# Patient Record
Sex: Male | Born: 1975 | Race: White | Hispanic: No | Marital: Married | Smoking: Never smoker
Health system: Southern US, Community
[De-identification: ages and names within clinical notes are randomized; demographics above are authoritative.]

## PROBLEM LIST (undated history)

## (undated) DIAGNOSIS — F329 Major depressive disorder, single episode, unspecified: Secondary | ICD-10-CM

## (undated) DIAGNOSIS — F32A Depression, unspecified: Secondary | ICD-10-CM

## (undated) DIAGNOSIS — T7840XA Allergy, unspecified, initial encounter: Secondary | ICD-10-CM

## (undated) DIAGNOSIS — G4700 Insomnia, unspecified: Secondary | ICD-10-CM

## (undated) DIAGNOSIS — K219 Gastro-esophageal reflux disease without esophagitis: Secondary | ICD-10-CM

## (undated) DIAGNOSIS — F524 Premature ejaculation: Secondary | ICD-10-CM

## (undated) DIAGNOSIS — M1990 Unspecified osteoarthritis, unspecified site: Secondary | ICD-10-CM

## (undated) DIAGNOSIS — L719 Rosacea, unspecified: Secondary | ICD-10-CM

## (undated) HISTORY — DX: Premature ejaculation: F52.4

## (undated) HISTORY — PX: ORIF METATARSAL FRACTURE: SUR942

## (undated) HISTORY — DX: Rosacea, unspecified: L71.9

## (undated) HISTORY — DX: Insomnia, unspecified: G47.00

## (undated) HISTORY — DX: Depression, unspecified: F32.A

## (undated) HISTORY — DX: Allergy, unspecified, initial encounter: T78.40XA

## (undated) HISTORY — DX: Unspecified osteoarthritis, unspecified site: M19.90

## (undated) HISTORY — DX: Major depressive disorder, single episode, unspecified: F32.9

## (undated) HISTORY — PX: FRACTURE SURGERY: SHX138

---

## 2008-09-05 HISTORY — PX: NASAL SEPTOPLASTY W/ TURBINOPLASTY: SHX2070

## 2008-09-17 ENCOUNTER — Ambulatory Visit: Payer: Self-pay | Admitting: Otolaryngology

## 2009-01-24 ENCOUNTER — Emergency Department: Payer: Self-pay | Admitting: Emergency Medicine

## 2013-02-28 ENCOUNTER — Ambulatory Visit: Payer: Self-pay | Admitting: Family Medicine

## 2014-04-15 ENCOUNTER — Ambulatory Visit: Payer: Self-pay | Admitting: Family Medicine

## 2014-07-29 IMAGING — CR DG THORACIC SPINE 2-3V
1 series · 4 of 4 positions shown · non-contrast
Comparison: none

REASON FOR EXAM: thoracic back pain
COMMENTS:

PROCEDURE:     KDR - KDXR THORACIC AP AND LATERAL  - February 28, 2013  [DATE]
RESULT:     AP and lateral projections of the thoracic spine show grossly
normal alignment with preservation of the disc bases and vertebral body
heights.

[Series 1: ap · 0.17mm/px · 4 of 4 slices shown]
[im 1/4]
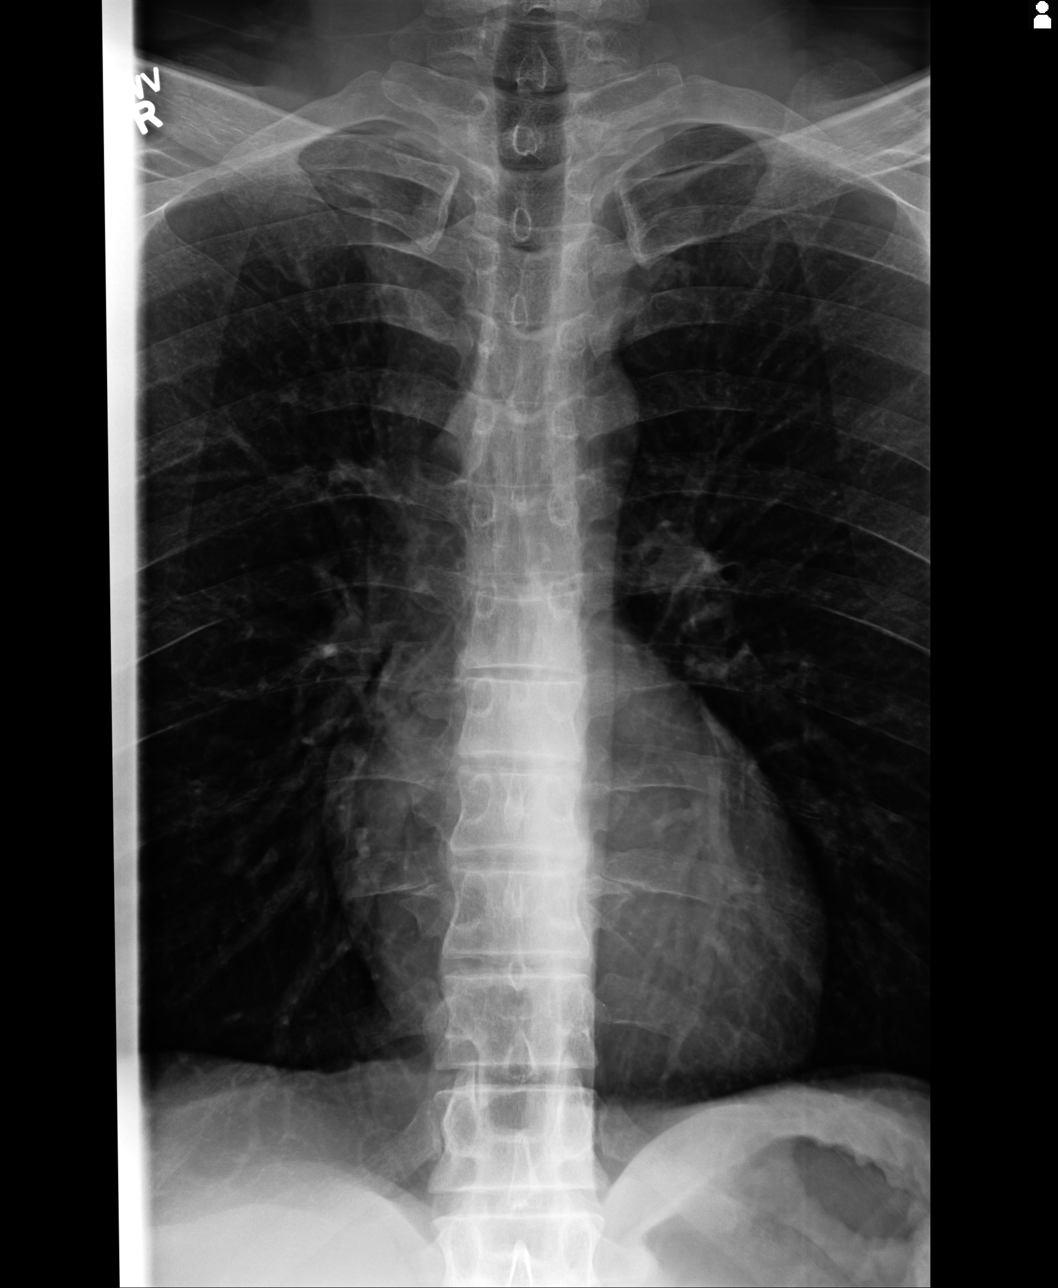
[im 2/4]
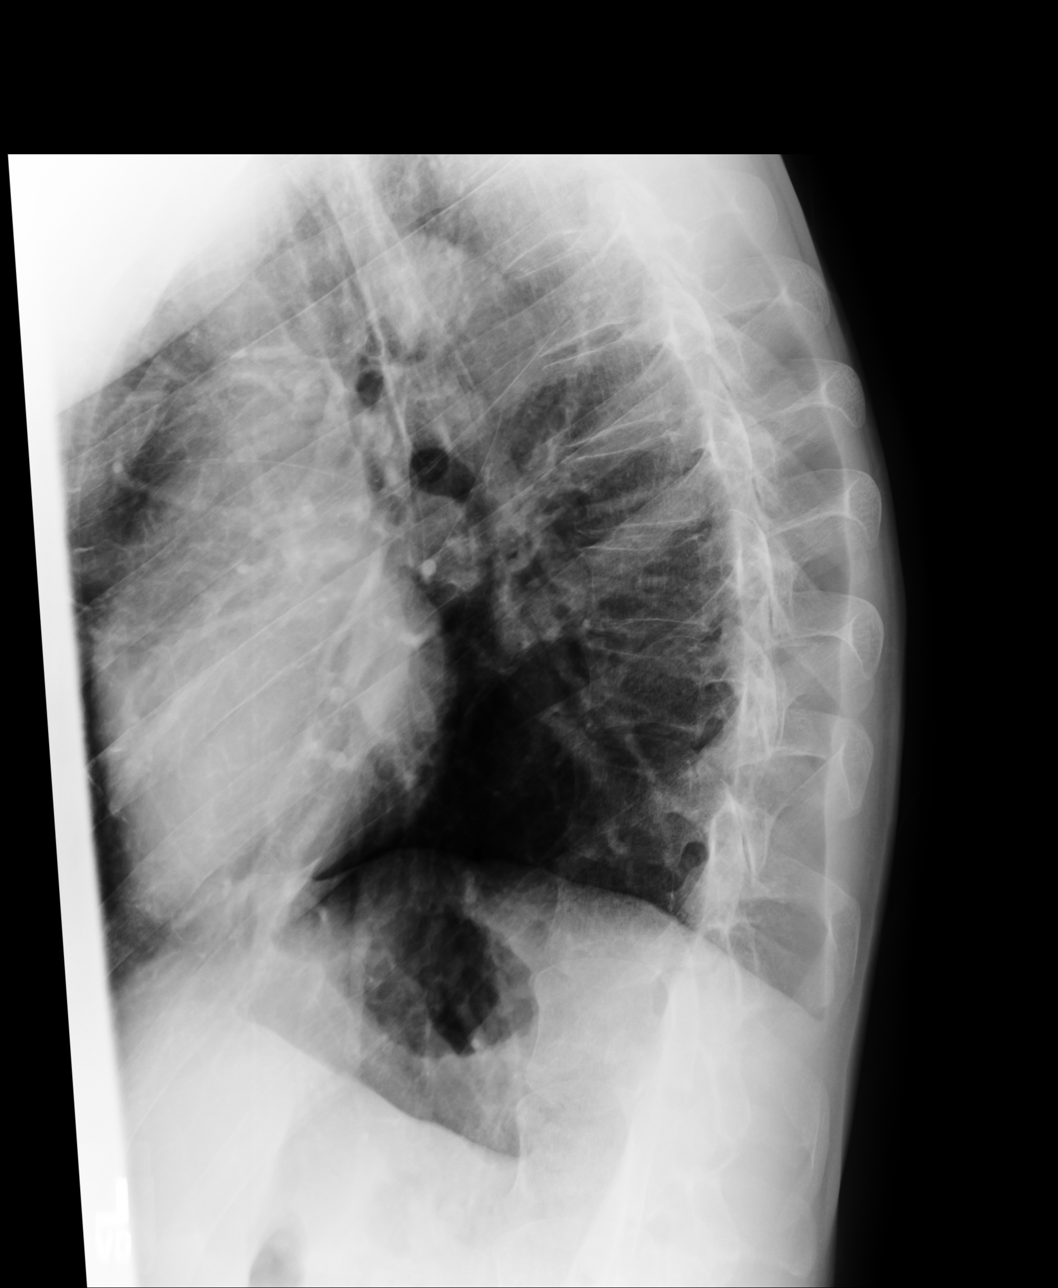
[im 3/4]
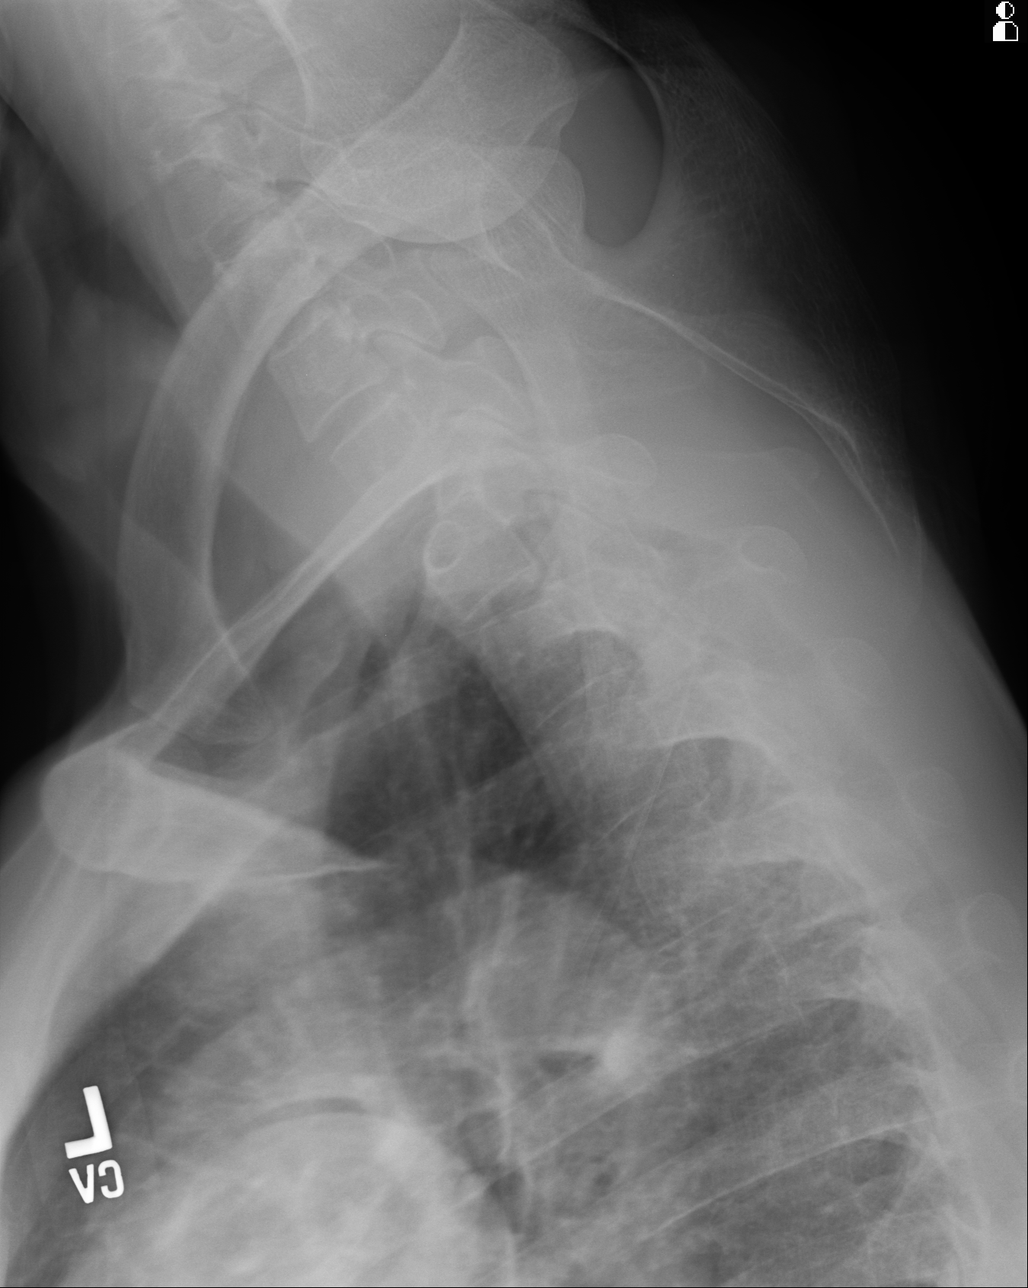
[im 4/4]
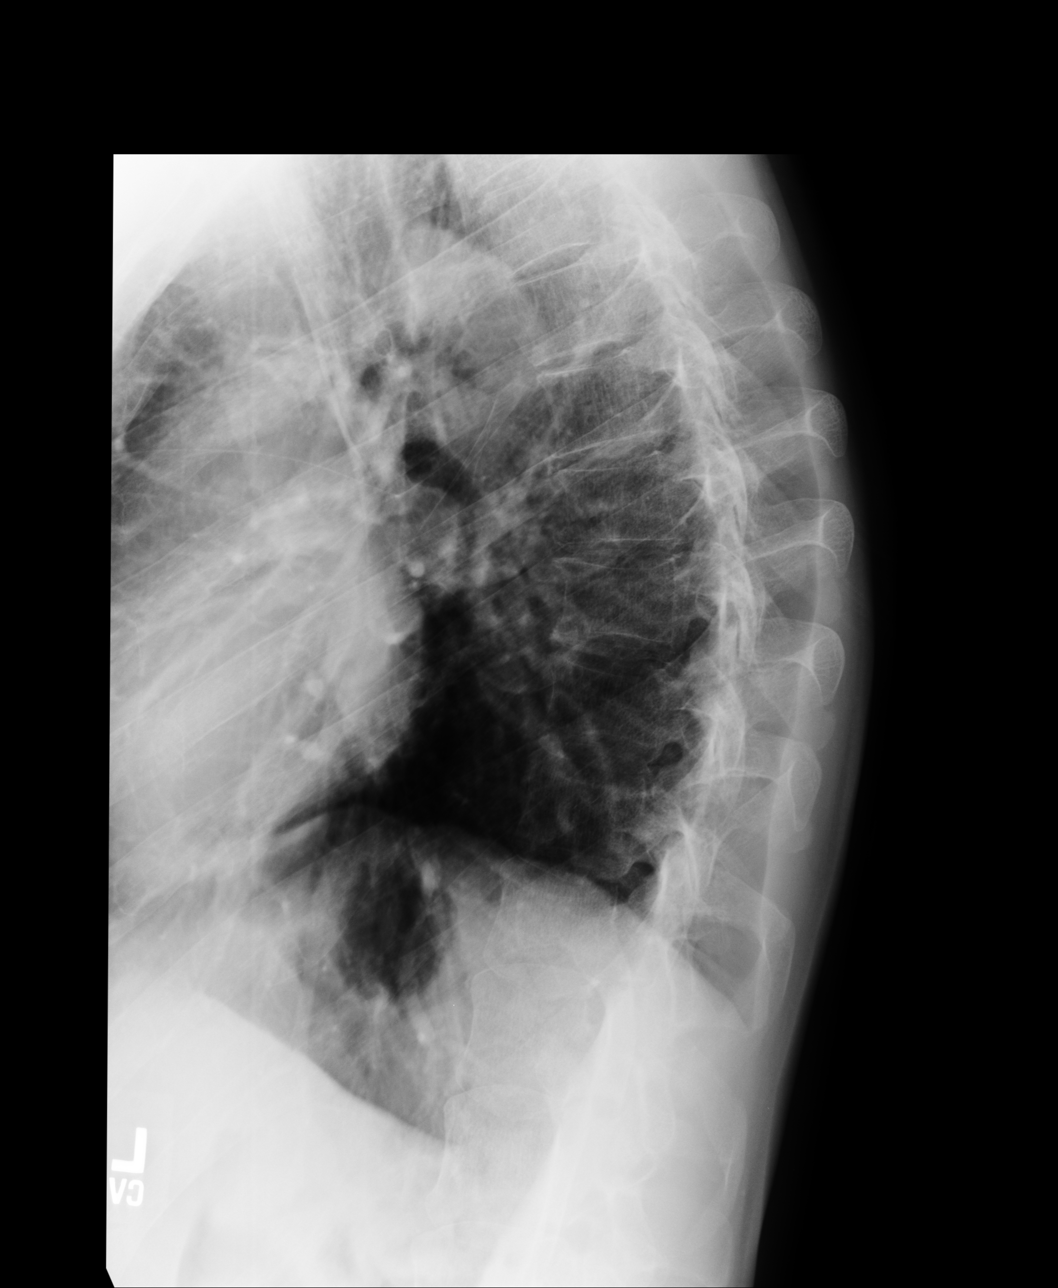

[4 of 4 positions shown; findings below may reference images not displayed]

IMPRESSION: No acute bony abnormality.

[REDACTED]

## 2014-08-14 HISTORY — PX: LAMINECTOMY: SHX219

## 2014-11-18 LAB — LIPID PANEL
Cholesterol: 163 mg/dL (ref 0–200)
HDL: 67 mg/dL (ref 35–70)
LDL Cholesterol: 81 mg/dL
Triglycerides: 77 mg/dL (ref 40–160)

## 2015-04-11 ENCOUNTER — Encounter: Payer: Self-pay | Admitting: Family Medicine

## 2015-04-11 DIAGNOSIS — M47817 Spondylosis without myelopathy or radiculopathy, lumbosacral region: Secondary | ICD-10-CM | POA: Insufficient documentation

## 2015-04-11 DIAGNOSIS — G4700 Insomnia, unspecified: Secondary | ICD-10-CM | POA: Insufficient documentation

## 2015-04-11 DIAGNOSIS — K5909 Other constipation: Secondary | ICD-10-CM | POA: Insufficient documentation

## 2015-04-11 DIAGNOSIS — J3089 Other allergic rhinitis: Secondary | ICD-10-CM

## 2015-04-11 DIAGNOSIS — F524 Premature ejaculation: Secondary | ICD-10-CM | POA: Insufficient documentation

## 2015-04-11 DIAGNOSIS — G4726 Circadian rhythm sleep disorder, shift work type: Secondary | ICD-10-CM | POA: Insufficient documentation

## 2015-04-11 DIAGNOSIS — J302 Other seasonal allergic rhinitis: Secondary | ICD-10-CM | POA: Insufficient documentation

## 2015-04-11 DIAGNOSIS — L719 Rosacea, unspecified: Secondary | ICD-10-CM | POA: Insufficient documentation

## 2015-04-11 DIAGNOSIS — M5136 Other intervertebral disc degeneration, lumbar region: Secondary | ICD-10-CM | POA: Insufficient documentation

## 2015-04-11 DIAGNOSIS — M5126 Other intervertebral disc displacement, lumbar region: Secondary | ICD-10-CM | POA: Insufficient documentation

## 2015-04-13 ENCOUNTER — Ambulatory Visit: Payer: Self-pay | Admitting: Family Medicine

## 2015-05-09 ENCOUNTER — Other Ambulatory Visit: Payer: Self-pay | Admitting: Family Medicine

## 2015-05-25 ENCOUNTER — Encounter: Payer: Self-pay | Admitting: Family Medicine

## 2015-05-25 ENCOUNTER — Ambulatory Visit (INDEPENDENT_AMBULATORY_CARE_PROVIDER_SITE_OTHER): Payer: BLUE CROSS/BLUE SHIELD | Admitting: Family Medicine

## 2015-05-25 VITALS — BP 110/66 | HR 70 | Temp 98.4°F | Resp 16 | Ht 70.0 in | Wt 167.0 lb

## 2015-05-25 DIAGNOSIS — G43009 Migraine without aura, not intractable, without status migrainosus: Secondary | ICD-10-CM | POA: Insufficient documentation

## 2015-05-25 DIAGNOSIS — Z23 Encounter for immunization: Secondary | ICD-10-CM

## 2015-05-25 DIAGNOSIS — F32A Depression, unspecified: Secondary | ICD-10-CM

## 2015-05-25 DIAGNOSIS — R1013 Epigastric pain: Secondary | ICD-10-CM

## 2015-05-25 DIAGNOSIS — G4700 Insomnia, unspecified: Secondary | ICD-10-CM

## 2015-05-25 DIAGNOSIS — F329 Major depressive disorder, single episode, unspecified: Secondary | ICD-10-CM

## 2015-05-25 MED ORDER — TRAZODONE HCL 100 MG PO TABS: 100.0000 mg | ORAL_TABLET | Freq: Every day | ORAL | Status: DC

## 2015-05-25 MED ORDER — OMEPRAZOLE 40 MG PO CPDR: 40.0000 mg | DELAYED_RELEASE_CAPSULE | Freq: Every day | ORAL | Status: DC

## 2015-05-25 MED ORDER — CITALOPRAM HYDROBROMIDE 40 MG PO TABS: 40.0000 mg | ORAL_TABLET | Freq: Every day | ORAL | Status: DC

## 2015-05-25 NOTE — Progress Notes (Signed)
Name: Ralph Mathews   MRN: [id number]    DOB: 1976-05-11   Date:05/25/2015       Progress Note  Subjective  Chief Complaint  Chief Complaint  Patient presents with  . Medication Refill    6 month F/U  . Depression  . Insomnia  . Abdominal Pain    epigastric pain onset off and on for 1 month and a little constipation    HPI  Insomnia: taking Trazodone and able to fall and stay asleep. He denies side effects of medication   Depression: taking Citalopram for many years, he feels like he is in remission. No side effects of medication .  Epigastric pain: symptoms started suddenly about one month ago. First episode the pain was aching and lasted three days, he states not associated with change in bowel movement ( constipation has been controlled ), since than episodes lasts at most one day, sometimes has abdominal distention and bloating, no blood in stools, no nausea , no vomiting, no weight loss, no fever. He has tried gas-X without improvement of symptoms.   Migraine Headaches: he has episode of left temporal area pain, described as throbbing, severe, has phonophobia and photophobia, but no nausea or vomiting. Symptoms started many years ago and resolves with Excedrin otc.     Patient Active Problem List   Diagnosis Date Noted  . Migraine without aura and without status migrainosus, not intractable 05/25/2015  . Insomnia, persistent 04/11/2015  . Chronic constipation 04/11/2015  . Clinical depression 04/11/2015  . Lumbosacral spondylosis without myelopathy 04/11/2015  . Circadian rhythm sleep disorder, shift work type 04/11/2015  . Bulge of lumbar disc without myelopathy 04/11/2015  . Ejaculates too soon 04/11/2015  . Allergic rhinitis 04/11/2015  . Acne erythematosa 04/11/2015     Family History  Problem Relation Age of Onset  . Depression Mother   . Hypertension Mother   . Obesity Mother   . Anxiety disorder Father     Social History   Social History  . Marital  Status: Married    Spouse Name: N/A  . Number of Children: N/A  . Years of Education: N/A   Occupational History  . Not on file.   Social History Main Topics  . Smoking status: Never Smoker   . Smokeless tobacco: Never Used  . Alcohol Use: 0.0 oz/week    0 Standard drinks or equivalent per week  . Drug Use: No  . Sexual Activity: Yes   Other Topics Concern  . Not on file   Social History Narrative     Current outpatient prescriptions:  .  citalopram (CELEXA) 40 MG tablet, Take 1 tablet (40 mg total) by mouth daily., Disp: 90 tablet, Rfl: 1 .  omeprazole (PRILOSEC) 40 MG capsule, Take 1 capsule (40 mg total) by mouth daily., Disp: 30 capsule, Rfl: 1 .  traZODone (DESYREL) 100 MG tablet, Take 1 tablet (100 mg total) by mouth at bedtime., Disp: 90 tablet, Rfl: 1  No Known Allergies   ROS  Constitutional: Negative for fever or weight change.  Respiratory: Negative for cough and shortness of breath.   Cardiovascular: Negative for chest pain or palpitations.  Gastrointestinal: Positive  for abdominal pain, no bowel changes.  Musculoskeletal: Negative for gait problem or joint swelling.  Skin: Negative for rash.  Neurological: Negative for dizziness or headache.  No other specific complaints in a complete review of systems (except as listed in HPI above).  Objective  Filed Vitals:   05/25/15 1109  BP:  110/66  Pulse: 70  Temp: 98.4 F (36.9 C)  TempSrc: Oral  Resp: 16  Height: 5\' 10"  (1.778 m)  Weight: 167 lb (75.751 kg)  SpO2: 98%    Body mass index is 23.96 kg/(m^2).  Physical Exam  Constitutional: Patient appears well-developed and well-nourished. No distress.  HEENT: head atraumatic, normocephalic, pupils equal and reactive to light,  neck supple, throat within normal limits Cardiovascular: Normal rate, regular rhythm and normal heart sounds.  No murmur heard. No BLE edema. Pulmonary/Chest: Effort normal and breath sounds normal. No respiratory  distress. Abdominal: Soft.  There is mild epigastric discomfort, also some pain on LLQ, no guarding or rebound tenderness Psychiatric: Patient has a normal mood and affect. behavior is normal. Judgment and thought content normal.   PHQ2/9: Depression screen PHQ 2/9 05/25/2015  Decreased Interest 0  Down, Depressed, Hopeless 0  PHQ - 2 Score 0     Fall Risk: Fall Risk  05/25/2015  Falls in the past year? No      Functional Status Survey: Is the patient deaf or have difficulty hearing?: No Does the patient have difficulty seeing, even when wearing glasses/contacts?: Yes (glasses) Does the patient have difficulty concentrating, remembering, or making decisions?: No Does the patient have difficulty walking or climbing stairs?: No Does the patient have difficulty dressing or bathing?: No Does the patient have difficulty doing errands alone such as visiting a doctor's office or shopping?: No   Assessment & Plan  1. Insomnia, persistent Doing well continue medication  - traZODone (DESYREL) 100 MG tablet; Take 1 tablet (100 mg total) by mouth at bedtime.  Dispense: 90 tablet; Refill: 1  2. Clinical depression Doing well on medication  - citalopram (CELEXA) 40 MG tablet; Take 1 tablet (40 mg total) by mouth daily.  Dispense: 90 tablet; Refill: 1  3. Needs flu shot  - Flu Vaccine QUAD 36+ mos PF IM (Fluarix & Fluzone Quad PF)  4. Epigastric pain On exam also some pain on LLQ, but Bristol scale was 4, discussed options, such as getting labs today : h. Pylori, CBC, comp panel, or try Omeprazole, and he chose the later, he will return in one month if no change in symptoms, if improves he will wean self off slowly  - omeprazole (PRILOSEC) 40 MG capsule; Take 1 capsule (40 mg total) by mouth daily.  Dispense: 30 capsule; Refill: 1  5. Migraine without aura and without status migrainosus, not intractable Doing well on prn medication

## 2015-05-26 ENCOUNTER — Ambulatory Visit: Payer: Self-pay | Admitting: Family Medicine

## 2015-06-18 ENCOUNTER — Other Ambulatory Visit: Payer: Self-pay | Admitting: Family Medicine

## 2015-06-18 NOTE — Telephone Encounter (Signed)
Informed patient and he will call back at a later date to schedule the 6 month follow up visit.

## 2015-12-21 ENCOUNTER — Other Ambulatory Visit: Payer: Self-pay | Admitting: Family Medicine

## 2015-12-22 NOTE — Telephone Encounter (Signed)
Patient requesting refill. 

## 2015-12-23 NOTE — Telephone Encounter (Signed)
Appointment made for 01-25-16 and patient informed that prescription has been refilled.

## 2016-01-25 ENCOUNTER — Ambulatory Visit: Payer: BLUE CROSS/BLUE SHIELD | Admitting: Family Medicine

## 2016-01-26 ENCOUNTER — Ambulatory Visit: Payer: BLUE CROSS/BLUE SHIELD | Admitting: Family Medicine

## 2016-02-02 ENCOUNTER — Encounter: Payer: Self-pay | Admitting: Family Medicine

## 2016-02-02 ENCOUNTER — Ambulatory Visit (INDEPENDENT_AMBULATORY_CARE_PROVIDER_SITE_OTHER): Payer: BLUE CROSS/BLUE SHIELD | Admitting: Family Medicine

## 2016-02-02 VITALS — BP 118/60 | HR 79 | Temp 98.8°F | Resp 16 | Ht 70.0 in | Wt 166.5 lb

## 2016-02-02 DIAGNOSIS — J3089 Other allergic rhinitis: Secondary | ICD-10-CM

## 2016-02-02 DIAGNOSIS — G4700 Insomnia, unspecified: Secondary | ICD-10-CM

## 2016-02-02 DIAGNOSIS — G43009 Migraine without aura, not intractable, without status migrainosus: Secondary | ICD-10-CM

## 2016-02-02 DIAGNOSIS — K219 Gastro-esophageal reflux disease without esophagitis: Secondary | ICD-10-CM | POA: Diagnosis not present

## 2016-02-02 DIAGNOSIS — F329 Major depressive disorder, single episode, unspecified: Secondary | ICD-10-CM | POA: Diagnosis not present

## 2016-02-02 DIAGNOSIS — J302 Other seasonal allergic rhinitis: Secondary | ICD-10-CM

## 2016-02-02 DIAGNOSIS — J309 Allergic rhinitis, unspecified: Secondary | ICD-10-CM

## 2016-02-02 DIAGNOSIS — F32A Depression, unspecified: Secondary | ICD-10-CM

## 2016-02-02 MED ORDER — ASPIRIN-ACETAMINOPHEN-CAFFEINE 250-250-65 MG PO TABS: 1.0000 | ORAL_TABLET | Freq: Four times a day (QID) | ORAL | Status: DC | PRN

## 2016-02-02 MED ORDER — TRAZODONE HCL 100 MG PO TABS: 100.0000 mg | ORAL_TABLET | Freq: Every evening | ORAL | Status: DC

## 2016-02-02 MED ORDER — CITALOPRAM HYDROBROMIDE 40 MG PO TABS: 40.0000 mg | ORAL_TABLET | Freq: Every day | ORAL | Status: DC

## 2016-02-02 MED ORDER — CETIRIZINE HCL 10 MG PO TABS: 10.0000 mg | ORAL_TABLET | Freq: Every day | ORAL | Status: DC

## 2016-02-02 MED ORDER — RANITIDINE HCL 150 MG PO TABS: 150.0000 mg | ORAL_TABLET | Freq: Two times a day (BID) | ORAL | Status: DC

## 2016-02-02 NOTE — Progress Notes (Signed)
Name: Ralph Mathews   MRN: [id number]    DOB: [birth year]   Date:02/02/2016       Progress Note  Subjective  Chief Complaint  Chief Complaint  Patient presents with  . Depression    medication refills    HPI  Insomnia: taking Trazodone and able to fall and stay asleep. He denies side effects of medication. No parasomnia.   Depression: taking Citalopram for many years No side effects of medication. He states he has noticed some lack of motivation lately. Currently works at [employer], but has summers off, needs to find a temporary job and is having difficulty getting started  Epigastric pain: symptoms resolved, seldom has some pain or heartburn that has been controlled with otc medication. Off Omeprazole now  Migraine Headaches: he has episodesof left temporal area pain, described as throbbing, severe, has phonophobia and photophobia, but no nausea or vomiting. Symptoms started many years ago and resolves with Excedrin otc.At most 2 episodes per month, but can last up to 3 days. Able to work through it    Patient Active Problem List   Diagnosis Date Noted  . Migraine without aura and without status migrainosus, not intractable 05/25/2015  . Insomnia, persistent 04/11/2015  . Chronic constipation 04/11/2015  . Clinical depression 04/11/2015  . Lumbosacral spondylosis without myelopathy 04/11/2015  . Circadian rhythm sleep disorder, shift work type 04/11/2015  . Bulge of lumbar disc without myelopathy 04/11/2015  . Ejaculates too soon 04/11/2015  . Perennial allergic rhinitis with seasonal variation 04/11/2015    Past Surgical History  Procedure Laterality Date  . Nasal septoplasty w/ turbinoplasty  09/05/2008  . Orif metatarsal fracture Right   . Laminectomy  08/14/2014    Family History  Problem Relation Age of Onset  . Depression Mother   . Hypertension Mother   . Obesity Mother   . Anxiety disorder Father     Social History   Social History  .  Marital Status: Married    Spouse Name: N/A  . Number of Children: N/A  . Years of Education: N/A   Occupational History  . Not on file.   Social History Main Topics  . Smoking status: Never Smoker   . Smokeless tobacco: Never Used  . Alcohol Use: 0.0 oz/week    0 Standard drinks or equivalent per week  . Drug Use: No  . Sexual Activity: Yes   Other Topics Concern  . Not on file   Social History Narrative     Current outpatient prescriptions:  .  aspirin-acetaminophen-caffeine (EXCEDRIN MIGRAINE) 250-250-65 MG tablet, Take 1 tablet by mouth every 6 (six) hours as needed for headache., Disp: 30 tablet, Rfl: 0 .  cetirizine (ZYRTEC) 10 MG tablet, Take 1 tablet (10 mg total) by mouth daily., Disp: 30 tablet, Rfl: 0 .  citalopram (CELEXA) 40 MG tablet, Take 1 tablet (40 mg total) by mouth daily., Disp: 90 tablet, Rfl: 1 .  ranitidine (ZANTAC) 150 MG tablet, Take 1 tablet (150 mg total) by mouth 2 (two) times daily., Disp: 30 tablet, Rfl: 0 .  traZODone (DESYREL) 100 MG tablet, Take 1 tablet (100 mg total) by mouth every evening., Disp: 90 tablet, Rfl: 1  No Known Allergies   ROS  Ten systems reviewed and is negative except as mentioned in HPI   Objective  Filed Vitals:   02/02/16 1619  BP: 118/60  Pulse: 79  Temp: 98.8 F (37.1 C)  TempSrc: Oral  Resp: 16  Height: 5\' 10"  (1.778  m)  Weight: 166 lb 8 oz (75.524 kg)  SpO2: 98%    Body mass index is 23.89 kg/(m^2).  Physical Exam  Constitutional: Patient appears well-developed and well-nourished.  No distress.  HEENT: head atraumatic, normocephalic, pupils equal and reactive to light, neck supple, throat within normal limits Cardiovascular: Normal rate, regular rhythm and normal heart sounds.  No murmur heard. No BLE edema. Pulmonary/Chest: Effort normal and breath sounds normal. No respiratory distress. Abdominal: Soft.  There is mild supra-pubic  Tenderness. It may be muscular, no dysuria or change in bowel  movement Psychiatric: Patient has a normal mood and affect. behavior is normal. Judgment and thought content normal.  PHQ2/9: Depression screen [facility] 2/9 02/02/2016 05/25/2015  Decreased Interest 0 0  Down, Depressed, Hopeless 0 0  PHQ - 2 Score 0 0    Fall Risk: Fall Risk  02/02/2016 05/25/2015  Falls in the past year? No No    Functional Status Survey: Is the patient deaf or have difficulty hearing?: No Does the patient have difficulty seeing, even when wearing glasses/contacts?: No Does the patient have difficulty concentrating, remembering, or making decisions?: No Does the patient have difficulty walking or climbing stairs?: No Does the patient have difficulty dressing or bathing?: No Does the patient have difficulty doing errands alone such as visiting a doctor's office or shopping?: No    Assessment & Plan  1. Migraine without aura and without status migrainosus, not intractable  - aspirin-acetaminophen-caffeine (EXCEDRIN MIGRAINE) 250-250-65 MG tablet; Take 1 tablet by mouth every 6 (six) hours as needed for headache.  Dispense: 30 tablet; Refill: 0  2. Insomnia, persistent  - traZODone (DESYREL) 100 MG tablet; Take 1 tablet (100 mg total) by mouth every evening.  Dispense: 90 tablet; Refill: 1  3. Clinical depression  - citalopram (CELEXA) 40 MG tablet; Take 1 tablet (40 mg total) by mouth daily.  Dispense: 90 tablet; Refill: 1  4. Perennial allergic rhinitis with seasonal variation  - cetirizine (ZYRTEC) 10 MG tablet; Take 1 tablet (10 mg total) by mouth daily.  Dispense: 30 tablet; Refill: 0  5. Gastroesophageal reflux disease without esophagitis  - ranitidine (ZANTAC) 150 MG tablet; Take 1 tablet (150 mg total) by mouth 2 (two) times daily.  Dispense: 30 tablet; Refill: 0

## 2016-08-05 ENCOUNTER — Ambulatory Visit (INDEPENDENT_AMBULATORY_CARE_PROVIDER_SITE_OTHER): Payer: BLUE CROSS/BLUE SHIELD | Admitting: Family Medicine

## 2016-08-05 ENCOUNTER — Encounter: Payer: Self-pay | Admitting: Family Medicine

## 2016-08-05 VITALS — BP 122/68 | HR 65 | Temp 97.8°F | Resp 16 | Ht 70.0 in | Wt 169.1 lb

## 2016-08-05 DIAGNOSIS — G4700 Insomnia, unspecified: Secondary | ICD-10-CM | POA: Diagnosis not present

## 2016-08-05 DIAGNOSIS — Z9889 Other specified postprocedural states: Secondary | ICD-10-CM | POA: Diagnosis not present

## 2016-08-05 DIAGNOSIS — Z13220 Encounter for screening for lipoid disorders: Secondary | ICD-10-CM

## 2016-08-05 DIAGNOSIS — G43009 Migraine without aura, not intractable, without status migrainosus: Secondary | ICD-10-CM | POA: Diagnosis not present

## 2016-08-05 DIAGNOSIS — Z131 Encounter for screening for diabetes mellitus: Secondary | ICD-10-CM

## 2016-08-05 DIAGNOSIS — M5416 Radiculopathy, lumbar region: Secondary | ICD-10-CM

## 2016-08-05 DIAGNOSIS — Z0000 Encounter for general adult medical examination without abnormal findings: Secondary | ICD-10-CM

## 2016-08-05 DIAGNOSIS — F325 Major depressive disorder, single episode, in full remission: Secondary | ICD-10-CM

## 2016-08-05 DIAGNOSIS — Z23 Encounter for immunization: Secondary | ICD-10-CM | POA: Diagnosis not present

## 2016-08-05 DIAGNOSIS — Z79899 Other long term (current) drug therapy: Secondary | ICD-10-CM

## 2016-08-05 LAB — CBC WITH DIFFERENTIAL/PLATELET
Basophils Absolute: 0 cells/uL (ref 0–200)
Basophils Relative: 0 %
Eosinophils Absolute: 240 cells/uL (ref 15–500)
Eosinophils Relative: 5 %
HCT: 49.2 % (ref 38.5–50.0)
Hemoglobin: 16.2 g/dL (ref 13.2–17.1)
Lymphocytes Relative: 31 %
Lymphs Abs: 1488 cells/uL (ref 850–3900)
MCH: 30.1 pg (ref 27.0–33.0)
MCHC: 32.9 g/dL (ref 32.0–36.0)
MCV: 91.4 fL (ref 80.0–100.0)
MPV: 8.9 fL (ref 7.5–12.5)
Monocytes Absolute: 528 cells/uL (ref 200–950)
Monocytes Relative: 11 %
Neutro Abs: 2544 cells/uL (ref 1500–7800)
Neutrophils Relative %: 53 %
Platelets: 216 10*3/uL (ref 140–400)
RBC: 5.38 MIL/uL (ref 4.20–5.80)
RDW: 14 % (ref 11.0–15.0)
WBC: 4.8 10*3/uL (ref 3.8–10.8)

## 2016-08-05 MED ORDER — PREDNISONE 10 MG (48) PO TBPK: ORAL_TABLET | Freq: Every day | ORAL | 0 refills | Status: DC

## 2016-08-05 MED ORDER — CITALOPRAM HYDROBROMIDE 40 MG PO TABS
40.0000 mg | ORAL_TABLET | Freq: Every day | ORAL | 1 refills | Status: DC
Start: 2016-08-05 — End: 2017-03-06

## 2016-08-05 MED ORDER — TRAZODONE HCL 100 MG PO TABS: 100.0000 mg | ORAL_TABLET | Freq: Every evening | ORAL | 1 refills | Status: DC

## 2016-08-05 MED ORDER — SUMATRIPTAN SUCCINATE 100 MG PO TABS: 100.0000 mg | ORAL_TABLET | ORAL | 0 refills | Status: DC | PRN

## 2016-08-05 NOTE — Progress Notes (Signed)
Name: Ralph Mathews   MRN: [id number]    DOB: 07-01-1976   Date:08/05/2016       Progress Note  Subjective  Chief Complaint  Chief Complaint  Patient presents with  . Annual Exam    HPI  Well Male Exam: he is feeling well, he exercises on a regular basis, he still has premature ejaculation, but seems to not interfere with his life. He has a balanced diet.   History of back surgery: back in 2015 by Dr. Hal Neer, he has noticed burning sensation on right sacral area and radiates down right lateral thigh, that is worse when sitting for a prolonged period of time or extending right leg. No weakness, no bowel or bladder incontinence.   Depression: taking Citalopram for many years No side effects of medication. Switching jobs in a couple of weeks, used to work at [employer] but had [employer] but will start at [employer] 08/15/2016  Migraine Headaches: he has episodes of left temporal area pain, described as starting as a throbbing sensation and sometimes radiates to frontal area, at times it is  severe, has phonophobia and photophobia, occasionally associated with nausea or vomiting. Symptoms started many years ago and resolves with Excedrin otc.At most 2 episodes per month, but can last up to 3 days. Able to work through it. Last episode was a couple of days ago and it was very intense. We will try triptans and he is willing to try it. Discussed possible side effects. May need to go on preventive medication  Insomnia: he has been taking Trazodone and is able to fall and stay asleep most nights.     Patient Active Problem List   Diagnosis Date Noted  . Major depression in remission ([place]) 08/05/2016  . Migraine without aura and without status migrainosus, not intractable 05/25/2015  . Insomnia, persistent 04/11/2015  . Chronic constipation 04/11/2015  . Lumbosacral spondylosis without myelopathy 04/11/2015  . Circadian rhythm sleep disorder, shift work type 04/11/2015  . Bulge of  lumbar disc without myelopathy 04/11/2015  . Ejaculates too soon 04/11/2015  . Perennial allergic rhinitis with seasonal variation 04/11/2015    Past Surgical History:  Procedure Laterality Date  . LAMINECTOMY  08/14/2014  . NASAL SEPTOPLASTY W/ TURBINOPLASTY  09/05/2008  . ORIF METATARSAL FRACTURE Right     Family History  Problem Relation Age of Onset  . Depression Mother   . Hypertension Mother   . Obesity Mother   . Anxiety disorder Father     Social History   Social History  . Marital status: Married    Spouse name: N/A  . Number of children: N/A  . Years of education: N/A   Occupational History  . Not on file.   Social History Main Topics  . Smoking status: Never Smoker  . Smokeless tobacco: Never Used  . Alcohol use 0.0 oz/week  . Drug use: No  . Sexual activity: Yes   Other Topics Concern  . Not on file   Social History Narrative  . No narrative on file     Current Outpatient Prescriptions:  .  aspirin-acetaminophen-caffeine (EXCEDRIN MIGRAINE) 250-250-65 MG tablet, Take 1 tablet by mouth every 6 (six) hours as needed for headache., Disp: 30 tablet, Rfl: 0 .  cetirizine (ZYRTEC) 10 MG tablet, Take 1 tablet (10 mg total) by mouth daily., Disp: 30 tablet, Rfl: 0 .  citalopram (CELEXA) 40 MG tablet, Take 1 tablet (40 mg total) by mouth daily., Disp: 90 tablet, Rfl: 1 .  SUMAtriptan (IMITREX) 100 MG tablet, Take 1 tablet (100 mg total) by mouth every 2 (two) hours as needed for migraine. May repeat in 2 hours if headache persists or recurs., Disp: 10 tablet, Rfl: 0 .  traZODone (DESYREL) 100 MG tablet, Take 1 tablet (100 mg total) by mouth every evening., Disp: 90 tablet, Rfl: 1  No Known Allergies   ROS  Constitutional: Negative for fever or weight change.  Respiratory: Negative for cough and shortness of breath.   Cardiovascular: Negative for chest pain or palpitations.  Gastrointestinal: Negative for abdominal pain, no bowel changes.   Musculoskeletal: Positive for gait problem - when he first starts to walk secondary to radiculitis, no joint swelling.  Skin: positive for rash.  Neurological: Negative for dizziness, positive for intermittent  headache.  No other specific complaints in a complete review of systems (except as listed in HPI above).   Objective  Vitals:   08/05/16 0825  BP: 122/68  Pulse: 65  Resp: 16  Temp: 97.8 F (36.6 C)  TempSrc: Oral  SpO2: 95%  Weight: 169 lb 1 oz (76.7 kg)  Height: 5\' 10"  (1.778 m)    Body mass index is 24.26 kg/m.  Physical Exam  Constitutional: Patient appears well-developed and well-nourished. No distress.  HENT: Head: Normocephalic and atraumatic. Ears: B TMs ok, no erythema or effusion; Nose: Nose normal. Mouth/Throat: Oropharynx is clear and moist. No oropharyngeal exudate.  Eyes: Conjunctivae and EOM are normal. Pupils are equal, round, and reactive to light. No scleral icterus.  Neck: Normal range of motion. Neck supple. No JVD present. No thyromegaly present.  Cardiovascular: Normal rate, regular rhythm and normal heart sounds.  No murmur heard. No BLE edema. Pulmonary/Chest: Effort normal and breath sounds normal. No respiratory distress. Abdominal: Soft. Bowel sounds are normal, no distension. There is no tenderness. no masses MALE GENITALIA: Normal descended testes bilaterally, no masses palpated, no hernias, no lesions, no discharge RECTAL: not done Musculoskeletal: Normal range of motion, no joint effusions. No gross deformities. Scar from previous laminectomy, pain during palpation of right lower back, pain when sitting up straight and with left lateral bending. Positive straight leg raise on the right  Neurological: he is alert and oriented to person, place, and time. No cranial nerve deficit. Coordination, balance, strength, speech and gait are normal.  Skin: Skin is warm and dry. He has some linear rash on both upper arms ( he was working in his yard a  couple of weeks ago -using topical medication and getting better now ) No erythema.  Psychiatric: Patient has a normal mood and affect. behavior is normal. Judgment and thought content normal.  PHQ2/9: Depression screen [facility] 2/9 08/05/2016 02/02/2016 05/25/2015  Decreased Interest 0 0 0  Down, Depressed, Hopeless 0 0 0  PHQ - 2 Score 0 0 0    Fall Risk: Fall Risk  08/05/2016 02/02/2016 05/25/2015  Falls in the past year? No No No    Functional Status Survey: Is the patient deaf or have difficulty hearing?: No Does the patient have difficulty seeing, even when wearing glasses/contacts?: No Does the patient have difficulty concentrating, remembering, or making decisions?: No Does the patient have difficulty walking or climbing stairs?: No Does the patient have difficulty dressing or bathing?: No Does the patient have difficulty doing errands alone such as visiting a doctor's office or shopping?: No   Assessment & Plan  1. Encounter for routine history and physical exam for male  Discussed importance of 150 minutes of physical  activity weekly, eat two servings of fish weekly, eat one serving of tree nuts ( cashews, pistachios, pecans, almonds.Marland Kitchen) every other day, eat 6 servings of fruit/vegetables daily and drink plenty of water and avoid sweet beverages.  - CBC with Differential/Platelet - COMPLETE METABOLIC PANEL WITH GFR - Hemoglobin A1c - Lipid panel - TSH - Vitamin B12 - VITAMIN D 25 Hydroxy (Vit-D Deficiency, Fractures)  2. Needs flu shot  - Flu Vaccine QUAD 36+ mos PF IM (Fluarix & Fluzone Quad PF)  3. Insomnia, persistent  - traZODone (DESYREL) 100 MG tablet; Take 1 tablet (100 mg total) by mouth every evening.  Dispense: 90 tablet; Refill: 1  4. Migraine without aura and without status migrainosus, not intractable  - SUMAtriptan (IMITREX) 100 MG tablet; Take 1 tablet (100 mg total) by mouth every 2 (two) hours as needed for migraine. May repeat in 2 hours if headache  persists or recurs.  Dispense: 10 tablet; Refill: 0  5. Major depression in remission ([place])  - citalopram (CELEXA) 40 MG tablet; Take 1 tablet (40 mg total) by mouth daily.  Dispense: 90 tablet; Refill: 1  6. History of back surgery  - predniSONE (STERAPRED UNI-PAK 48 TAB) 10 MG (48) TBPK tablet; Take by mouth daily. Take as directed - taper  Dispense: 48 tablet; Refill: 0  7. Right lumbar radiculitis  - predniSONE (STERAPRED UNI-PAK 48 TAB) 10 MG (48) TBPK tablet; Take by mouth daily. Take as directed - taper  Dispense: 48 tablet; Refill: 0

## 2016-08-06 LAB — COMPLETE METABOLIC PANEL WITH GFR
ALT: 14 U/L (ref 9–46)
AST: 20 U/L (ref 10–40)
Albumin: 4.2 g/dL (ref 3.6–5.1)
Alkaline Phosphatase: 56 U/L (ref 40–115)
BUN: 17 mg/dL (ref 7–25)
CO2: 29 mmol/L (ref 20–31)
Calcium: 9.1 mg/dL (ref 8.6–10.3)
Chloride: 104 mmol/L (ref 98–110)
Creat: 1.19 mg/dL (ref 0.60–1.35)
GFR, Est African American: 88 mL/min (ref >=60)
GFR, Est Non African American: 76 mL/min (ref >=60)
Glucose, Bld: 94 mg/dL (ref 65–99)
Potassium: 4.9 mmol/L (ref 3.5–5.3)
Sodium: 140 mmol/L (ref 135–146)
Total Bilirubin: 0.6 mg/dL (ref 0.2–1.2)
Total Protein: 6.6 g/dL (ref 6.1–8.1)

## 2016-08-06 LAB — LIPID PANEL
Cholesterol: 179 mg/dL (ref <200)
HDL: 65 mg/dL (ref >40)
LDL Cholesterol: 96 mg/dL (ref <100)
Total CHOL/HDL Ratio: 2.8 Ratio (ref <5.0)
Triglycerides: 91 mg/dL (ref <150)
VLDL: 18 mg/dL (ref <30)

## 2016-08-06 LAB — VITAMIN B12: Vitamin B-12: 540 pg/mL (ref 200–1100)

## 2016-08-06 LAB — TSH: TSH: 0.74 mIU/L (ref 0.40–4.50)

## 2016-08-06 LAB — VITAMIN D 25 HYDROXY (VIT D DEFICIENCY, FRACTURES): Vit D, 25-Hydroxy: 39 ng/mL (ref 30–100)

## 2016-08-06 LAB — HEMOGLOBIN A1C
Hgb A1c MFr Bld: 5.2 % (ref <5.7)
Mean Plasma Glucose: 103 mg/dL

## 2017-01-31 ENCOUNTER — Telehealth: Payer: Self-pay | Admitting: Family Medicine

## 2017-01-31 DIAGNOSIS — G4700 Insomnia, unspecified: Secondary | ICD-10-CM

## 2017-02-01 ENCOUNTER — Encounter: Payer: Self-pay | Admitting: Family Medicine

## 2017-02-01 NOTE — Telephone Encounter (Signed)
Raquel Sarna can see him, please schedule

## 2017-02-01 NOTE — Telephone Encounter (Signed)
You are exactly right - Please schedule the patient for his 43mo follow up with Dr. Ancil Boozer in the next 30 days. Please let him know that I have sent in a 30 day supply, and he will be able to obtain a larger refill once he comes for his follow up. Thank you!

## 2017-02-01 NOTE — Telephone Encounter (Signed)
Patient requesting refill of Trazodone to [facility]. Patient will probably need a follow up for his 6 month follow up for medication.

## 2017-02-06 NOTE — Telephone Encounter (Signed)
Tried contacting patient again no answer not able to leave voice message

## 2017-02-21 ENCOUNTER — Ambulatory Visit: Payer: BLUE CROSS/BLUE SHIELD | Admitting: Family Medicine

## 2017-03-06 ENCOUNTER — Encounter: Payer: Self-pay | Admitting: Family Medicine

## 2017-03-06 ENCOUNTER — Ambulatory Visit (INDEPENDENT_AMBULATORY_CARE_PROVIDER_SITE_OTHER): Payer: Commercial Managed Care - PPO | Admitting: Family Medicine

## 2017-03-06 VITALS — BP 116/68 | HR 73 | Temp 98.2°F | Resp 16 | Ht 70.0 in | Wt 172.5 lb

## 2017-03-06 DIAGNOSIS — G43009 Migraine without aura, not intractable, without status migrainosus: Secondary | ICD-10-CM

## 2017-03-06 DIAGNOSIS — G4700 Insomnia, unspecified: Secondary | ICD-10-CM | POA: Diagnosis not present

## 2017-03-06 DIAGNOSIS — Z79899 Other long term (current) drug therapy: Secondary | ICD-10-CM | POA: Diagnosis not present

## 2017-03-06 DIAGNOSIS — F325 Major depressive disorder, single episode, in full remission: Secondary | ICD-10-CM

## 2017-03-06 DIAGNOSIS — R202 Paresthesia of skin: Secondary | ICD-10-CM

## 2017-03-06 DIAGNOSIS — Z9889 Other specified postprocedural states: Secondary | ICD-10-CM

## 2017-03-06 MED ORDER — GABAPENTIN 100 MG PO CAPS: 100.0000 mg | ORAL_CAPSULE | Freq: Every day | ORAL | 0 refills | Status: DC

## 2017-03-06 MED ORDER — SUMATRIPTAN SUCCINATE 100 MG PO TABS: 100.0000 mg | ORAL_TABLET | ORAL | 0 refills | Status: DC | PRN

## 2017-03-06 MED ORDER — TRAZODONE HCL 100 MG PO TABS: 100.0000 mg | ORAL_TABLET | Freq: Every evening | ORAL | 1 refills | Status: DC

## 2017-03-06 MED ORDER — CITALOPRAM HYDROBROMIDE 40 MG PO TABS: 40.0000 mg | ORAL_TABLET | Freq: Every day | ORAL | 1 refills | Status: DC

## 2017-03-06 NOTE — Progress Notes (Signed)
Name: Ralph Mathews   MRN: [id number]    DOB: Nov 08, 1975   Date:03/06/2017       Progress Note  Subjective  Chief Complaint  Chief Complaint  Patient presents with  . Medication Refill    6 month F/U  . Migraine    Average-2 monthly  . Insomnia    Doing well, sleeping about 6-7 hour nightly  . Hip Pain    Onset-couple of months, left hip intermittently bothers him.    HPI  History of back surgery: back in 2015 by Dr. Hal Neer, last visit in Dec he was having radiculitis to right side, this time he has been noticing intermittent left foot numbness and also pain on left lower back that radiates to left outer hip. Pain is daily but not constant, he does not need to modify activities because of the pain.  No weakness, no bowel or bladder incontinence. He was given prednisone taper in Dec and symptoms on right side resolved.   Depression: taking Citalopram for many years No side effects of medication. Switching jobs again, going from [place] to [employer], day shift, occasional evening. He denies suicidal thoughts, anhedonia or change in appetite.   Migraine Headaches: he has episodes of left temporal area pain, described as starting as a throbbing sensation and sometimes radiates to frontal area, at times it is  severe, has phonophobia and photophobia, occasionally associated with nausea or vomiting. Symptoms started many years ago and resolves with Excedrin otc.At most 2 episodes per month, he used to take Excedrin migraine and episodes would last 3 days and with Imitrex episodes lasts at most one day.   Insomnia: he has been taking Trazodone and is able to fall and stay asleep most nights. No side effects of medication    Patient Active Problem List   Diagnosis Date Noted  . Major depression in remission ([place]) 08/05/2016  . History of back surgery 08/05/2016  . Migraine without aura and without status migrainosus, not intractable 05/25/2015  . Insomnia, persistent 04/11/2015  . Chronic  constipation 04/11/2015  . Lumbosacral spondylosis without myelopathy 04/11/2015  . Circadian rhythm sleep disorder, shift work type 04/11/2015  . Bulge of lumbar disc without myelopathy 04/11/2015  . Ejaculates too soon 04/11/2015  . Perennial allergic rhinitis with seasonal variation 04/11/2015    Past Surgical History:  Procedure Laterality Date  . LAMINECTOMY  08/14/2014  . NASAL SEPTOPLASTY W/ TURBINOPLASTY  09/05/2008  . ORIF METATARSAL FRACTURE Right     Family History  Problem Relation Age of Onset  . Depression Mother   . Hypertension Mother   . Obesity Mother   . Anxiety disorder Father     Social History   Social History  . Marital status: Married    Spouse name: N/A  . Number of children: N/A  . Years of education: N/A   Occupational History  . Not on file.   Social History Main Topics  . Smoking status: Never Smoker  . Smokeless tobacco: Never Used  . Alcohol use 0.0 oz/week  . Drug use: No  . Sexual activity: Yes   Other Topics Concern  . Not on file   Social History Narrative  . No narrative on file     Current Outpatient Prescriptions:  .  aspirin-acetaminophen-caffeine (EXCEDRIN MIGRAINE) 250-250-65 MG tablet, Take 1 tablet by mouth every 6 (six) hours as needed for headache., Disp: 30 tablet, Rfl: 0 .  cetirizine (ZYRTEC) 10 MG tablet, Take 1 tablet (10 mg total) by  mouth daily., Disp: 30 tablet, Rfl: 0 .  citalopram (CELEXA) 40 MG tablet, Take 1 tablet (40 mg total) by mouth daily., Disp: 90 tablet, Rfl: 1 .  SUMAtriptan (IMITREX) 100 MG tablet, Take 1 tablet (100 mg total) by mouth every 2 (two) hours as needed for migraine. May repeat in 2 hours if headache persists or recurs., Disp: 10 tablet, Rfl: 0 .  traZODone (DESYREL) 100 MG tablet, TAKE 1 TABLET (100 MG TOTAL) BY MOUTH EVERY EVENING., Disp: 30 tablet, Rfl: 0  No Known Allergies   ROS  Constitutional: Negative for fever or weight change.  Respiratory: Negative for cough and  shortness of breath.   Cardiovascular: Negative for chest pain or palpitations.  Gastrointestinal: Negative for abdominal pain, no bowel changes.  Musculoskeletal: Negative for gait problem or joint swelling.  Skin: Negative for rash.  Neurological: Negative for dizziness , positive for intermittent headache.  No other specific complaints in a complete review of systems (except as listed in HPI above).  Objective  Vitals:   03/06/17 1617  BP: 116/68  Pulse: 73  Resp: 16  Temp: 98.2 F (36.8 C)  TempSrc: Oral  SpO2: 98%  Weight: 172 lb 8 oz (78.2 kg)  Height: 5\' 10"  (1.778 m)    Body mass index is 24.75 kg/m.  Physical Exam  Constitutional: Patient appears well-developed and well-nourished. No distress.  HEENT: head atraumatic, normocephalic, pupils equal and reactive to light,  neck supple, throat within normal limits Cardiovascular: Normal rate, regular rhythm and normal heart sounds.  No murmur heard. No BLE edema. Pulmonary/Chest: Effort normal and breath sounds normal. No respiratory distress. Abdominal: Soft.  There is no tenderness. Psychiatric: Patient has a normal mood and affect. behavior is normal. Judgment and thought content normal. Muscular skeletal: negative straight leg raise, normal hip exam, pain on left lower back with internal rotation of the left hip.   PHQ2/9: Depression screen [facility] 2/9 03/06/2017 08/05/2016 02/02/2016 05/25/2015  Decreased Interest 0 0 0 0  Down, Depressed, Hopeless 0 0 0 0  PHQ - 2 Score 0 0 0 0     Fall Risk: Fall Risk  03/06/2017 08/05/2016 02/02/2016 05/25/2015  Falls in the past year? No No No No     Functional Status Survey: Is the patient deaf or have difficulty hearing?: No Does the patient have difficulty seeing, even when wearing glasses/contacts?: No Does the patient have difficulty concentrating, remembering, or making decisions?: No Does the patient have difficulty walking or climbing stairs?: No Does the patient have  difficulty dressing or bathing?: No Does the patient have difficulty doing errands alone such as visiting a doctor's office or shopping?: No   Assessment & Plan   1. Insomnia, persistent  - traZODone (DESYREL) 100 MG tablet; Take 1 tablet (100 mg total) by mouth every evening.  Dispense: 90 tablet; Refill: 1  2. Migraine without aura and without status migrainosus, not intractable  - SUMAtriptan (IMITREX) 100 MG tablet; Take 1 tablet (100 mg total) by mouth every 2 (two) hours as needed for migraine. May repeat in 2 hours if headache persists or recurs.  Dispense: 10 tablet; Refill: 0  3. Major depression in remission ([place])  - citalopram (CELEXA) 40 MG tablet; Take 1 tablet (40 mg total) by mouth daily.  Dispense: 90 tablet; Refill: 1  4. History of back surgery  - gabapentin (NEURONTIN) 100 MG capsule; Take 1-3 capsules (100-300 mg total) by mouth at bedtime.  Dispense: 90 capsule; Refill: 0  5.  Paresthesia of left foot  - gabapentin (NEURONTIN) 100 MG capsule; Take 1-3 capsules (100-300 mg total) by mouth at bedtime.  Dispense: 90 capsule; Refill: 0  6. High risk medication use  - EKG 12-Lead

## 2017-08-07 ENCOUNTER — Encounter: Payer: Self-pay | Admitting: Family Medicine

## 2017-08-07 ENCOUNTER — Ambulatory Visit (INDEPENDENT_AMBULATORY_CARE_PROVIDER_SITE_OTHER): Payer: Commercial Managed Care - PPO | Admitting: Family Medicine

## 2017-08-07 VITALS — BP 116/80 | HR 64 | Temp 97.7°F | Resp 12 | Ht 70.0 in | Wt 166.0 lb

## 2017-08-07 DIAGNOSIS — Z9889 Other specified postprocedural states: Secondary | ICD-10-CM

## 2017-08-07 DIAGNOSIS — G4700 Insomnia, unspecified: Secondary | ICD-10-CM

## 2017-08-07 DIAGNOSIS — Z0000 Encounter for general adult medical examination without abnormal findings: Secondary | ICD-10-CM | POA: Diagnosis not present

## 2017-08-07 DIAGNOSIS — R202 Paresthesia of skin: Secondary | ICD-10-CM

## 2017-08-07 DIAGNOSIS — G43009 Migraine without aura, not intractable, without status migrainosus: Secondary | ICD-10-CM | POA: Diagnosis not present

## 2017-08-07 DIAGNOSIS — Z79899 Other long term (current) drug therapy: Secondary | ICD-10-CM | POA: Diagnosis not present

## 2017-08-07 DIAGNOSIS — F325 Major depressive disorder, single episode, in full remission: Secondary | ICD-10-CM

## 2017-08-07 DIAGNOSIS — Z13220 Encounter for screening for lipoid disorders: Secondary | ICD-10-CM

## 2017-08-07 DIAGNOSIS — Z131 Encounter for screening for diabetes mellitus: Secondary | ICD-10-CM

## 2017-08-07 LAB — COMPLETE METABOLIC PANEL WITH GFR
AG Ratio: 1.8 (calc) (ref 1.0–2.5)
ALT: 15 U/L (ref 9–46)
AST: 19 U/L (ref 10–40)
Albumin: 4.1 g/dL (ref 3.6–5.1)
Alkaline phosphatase (APISO): 56 U/L (ref 40–115)
BUN: 17 mg/dL (ref 7–25)
CO2: 30 mmol/L (ref 20–32)
Calcium: 8.9 mg/dL (ref 8.6–10.3)
Chloride: 105 mmol/L (ref 98–110)
Creat: 1.25 mg/dL (ref 0.60–1.35)
GFR, Est African American: 83 mL/min/{1.73_m2} (ref >=60)
GFR, Est Non African American: 72 mL/min/{1.73_m2} (ref >=60)
Globulin: 2.3 g/dL (calc) (ref 1.9–3.7)
Glucose, Bld: 90 mg/dL (ref 65–99)
Potassium: 4.3 mmol/L (ref 3.5–5.3)
Sodium: 140 mmol/L (ref 135–146)
Total Bilirubin: 0.6 mg/dL (ref 0.2–1.2)
Total Protein: 6.4 g/dL (ref 6.1–8.1)

## 2017-08-07 LAB — LIPID PANEL
Cholesterol: 181 mg/dL (ref <200)
HDL: 67 mg/dL (ref >40)
LDL Cholesterol (Calc): 94 mg/dL (calc)
Non-HDL Cholesterol (Calc): 114 mg/dL (calc) (ref <130)
Total CHOL/HDL Ratio: 2.7 (calc) (ref <5.0)
Triglycerides: 106 mg/dL (ref <150)

## 2017-08-07 MED ORDER — TRAZODONE HCL 100 MG PO TABS: 100.0000 mg | ORAL_TABLET | Freq: Every evening | ORAL | 1 refills | Status: DC

## 2017-08-07 MED ORDER — SUMATRIPTAN SUCCINATE 100 MG PO TABS: 100.0000 mg | ORAL_TABLET | ORAL | 1 refills | Status: DC | PRN

## 2017-08-07 MED ORDER — CITALOPRAM HYDROBROMIDE 40 MG PO TABS: 40.0000 mg | ORAL_TABLET | Freq: Every day | ORAL | 1 refills | Status: DC

## 2017-08-07 MED ORDER — GABAPENTIN 100 MG PO CAPS: 100.0000 mg | ORAL_CAPSULE | Freq: Every day | ORAL | 1 refills | Status: DC

## 2017-08-07 MED ORDER — ONDANSETRON 4 MG PO TBDP: 4.0000 mg | ORAL_TABLET | Freq: Three times a day (TID) | ORAL | 0 refills | Status: DC | PRN

## 2017-08-07 NOTE — Progress Notes (Signed)
Name: Ralph Mathews   MRN: [id number]    DOB: Jun 01, 1976   Date:08/07/2017       Progress Note  Subjective  Chief Complaint  Chief Complaint  Patient presents with  . Annual Exam  . Follow-up    HPI  Male exam: he is sexually active with wife only, no chest pain, palpitation, urinary symptoms or penile discharge. No change in bowel movements. Eating healthy  History of back surgery: back in 2015 by Dr. Hal Neer, last visit in Dec he was having radiculitis to right side, this time he has been noticing intermittent left foot numbness and also pain on left lower back that radiates to left outer hip. Pain is daily but not constant, he does not need to modify activities because of the pain.  No weakness, no bowel or bladder incontinence. He is taking gabapentin for hip pain and is helping, takes in the mornings because it affects his sleep at night.    Depression: taking Citalopram for many years No side effects of medication. Switching jobs again, going from [place] to [employer], day shift, occasional evening. He denies suicidal thoughts, anhedonia or change in appetite. He is struggling financially, but doing well on medication.  Migraine Headaches: he has episodes of left temporal area pain, described as starting as a throbbing sensation and sometimes radiates to frontal area, at times it is severe, has phonophobia and photophobia, occasionally associated with nausea or vomiting. Symptoms started many years ago and resolves with Excedrin otc and Imitrex, having more nausea lately, 2 episodes per month, able to work, but episodes can last over 24 hours at times, we will add Zofran to take prn also.   Insomnia: he has been taking Trazodone and is able to fall and stay asleep most nights. No side effects of medication    Patient Active Problem List   Diagnosis Date Noted  . Major depression in remission ([place]) 08/05/2016  . History of back surgery 08/05/2016  . Migraine without aura and  without status migrainosus, not intractable 05/25/2015  . Insomnia, persistent 04/11/2015  . Chronic constipation 04/11/2015  . Lumbosacral spondylosis without myelopathy 04/11/2015  . Circadian rhythm sleep disorder, shift work type 04/11/2015  . Bulge of lumbar disc without myelopathy 04/11/2015  . Ejaculates too soon 04/11/2015  . Perennial allergic rhinitis with seasonal variation 04/11/2015    Past Surgical History:  Procedure Laterality Date  . LAMINECTOMY  08/14/2014  . NASAL SEPTOPLASTY W/ TURBINOPLASTY  09/05/2008  . ORIF METATARSAL FRACTURE Right     Family History  Problem Relation Age of Onset  . Depression Mother   . Hypertension Mother   . Obesity Mother   . Anxiety disorder Father     Social History   Socioeconomic History  . Marital status: Married    Spouse name: Estill Bamberg  . Number of children: 4  . Years of education: 23  . Highest education level: Bachelor's degree (e.g., BA, AB, BS)  Social Needs  . Financial resource strain: Very hard  . Food insecurity - worry: Sometimes true  . Food insecurity - inability: Sometimes true  . Transportation needs - medical: No  . Transportation needs - non-medical: No  Occupational History  . Occupation: cook   Tobacco Use  . Smoking status: Never Smoker  . Smokeless tobacco: Never Used  Substance and Sexual Activity  . Alcohol use: Yes    Alcohol/week: 3.6 oz    Types: 4 Cans of beer, 2 Standard drinks or equivalent per week  Comment: 2 mixed drinks and 4 beers a week   . Drug use: No    Comment: experimented with marijuana as a teenager  . Sexual activity: Yes    Birth control/protection: Other-see comments    Comment: wife had an ablation  Other Topics Concern  . Not on file  Social History Narrative   Married, has 4 daughters, mother-in-law helps them out ( taking care of children and financially )      Current Outpatient Medications:  .  aspirin-acetaminophen-caffeine (EXCEDRIN MIGRAINE) 250-250-65  MG tablet, Take 1 tablet by mouth every 6 (six) hours as needed for headache., Disp: 30 tablet, Rfl: 0 .  cetirizine (ZYRTEC) 10 MG tablet, Take 1 tablet (10 mg total) by mouth daily., Disp: 30 tablet, Rfl: 0 .  citalopram (CELEXA) 40 MG tablet, Take 1 tablet (40 mg total) by mouth daily., Disp: 90 tablet, Rfl: 1 .  gabapentin (NEURONTIN) 100 MG capsule, Take 1 capsule (100 mg total) by mouth daily., Disp: 90 capsule, Rfl: 1 .  SUMAtriptan (IMITREX) 100 MG tablet, Take 1 tablet (100 mg total) by mouth every 2 (two) hours as needed for migraine. May repeat in 2 hours if headache persists or recurs., Disp: 10 tablet, Rfl: 1 .  traZODone (DESYREL) 100 MG tablet, Take 1 tablet (100 mg total) by mouth every evening., Disp: 90 tablet, Rfl: 1 .  ondansetron (ZOFRAN ODT) 4 MG disintegrating tablet, Take 1 tablet (4 mg total) by mouth every 8 (eight) hours as needed for nausea or vomiting., Disp: 20 tablet, Rfl: 0  No Known Allergies   ROS  Constitutional: Negative for fever or weight change.  Respiratory: Negative for cough and shortness of breath.   Cardiovascular: Negative for chest pain or palpitations.  Gastrointestinal: Negative for abdominal pain, no bowel changes.  Musculoskeletal: Negative for gait problem or joint swelling.  Skin: Negative for rash.  Neurological: Negative for dizziness or headache.  No other specific complaints in a complete review of systems (except as listed in HPI above).  Objective  Vitals:   08/07/17 0822  BP: 116/80  Pulse: 64  Resp: 12  Temp: 97.7 F (36.5 C)  TempSrc: Oral  SpO2: 99%  Weight: 166 lb (75.3 kg)  Height: 5\' 10"  (1.778 m)    Body mass index is 23.82 kg/m.  Physical Exam  Constitutional: Patient appears well-developed and well-nourished. No distress.  HENT: Head: Normocephalic and atraumatic. Ears: B TMs ok, no erythema or effusion; Nose: Nose normal. Mouth/Throat: Oropharynx is clear and moist. No oropharyngeal exudate.  Eyes:  Conjunctivae and EOM are normal. Pupils are equal, round, and reactive to light. No scleral icterus.  Neck: Normal range of motion. Neck supple. No JVD present. No thyromegaly present.  Cardiovascular: Normal rate, regular rhythm and normal heart sounds.  No murmur heard. No BLE edema. Pulmonary/Chest: Effort normal and breath sounds normal. No respiratory distress. Abdominal: Soft. Bowel sounds are normal, no distension. There is no tenderness. no masses MALE GENITALIA: Normal descended testes bilaterally, no masses palpated, no hernias, no lesions, no discharge RECTAL: not done Musculoskeletal: Normal range of motion, no joint effusions. No gross deformities Neurological: he is alert and oriented to person, place, and time. No cranial nerve deficit. Coordination, balance, strength, speech and gait are normal.  Skin: Skin is warm and dry. No rash noted. No erythema.  Psychiatric: Patient has a normal mood and affect. behavior is normal. Judgment and thought content normal.  PHQ2/9: Depression screen [facility] 2/9 08/07/2017 03/06/2017 08/05/2016 02/02/2016  05/25/2015  Decreased Interest 0 0 0 0 0  Down, Depressed, Hopeless 0 0 0 0 0  PHQ - 2 Score 0 0 0 0 0     Fall Risk: Fall Risk  08/07/2017 03/06/2017 08/05/2016 02/02/2016 05/25/2015  Falls in the past year? No No No No No     Functional Status Survey: Is the patient deaf or have difficulty hearing?: No Does the patient have difficulty seeing, even when wearing glasses/contacts?: No Does the patient have difficulty concentrating, remembering, or making decisions?: No Does the patient have difficulty walking or climbing stairs?: No Does the patient have difficulty dressing or bathing?: No Does the patient have difficulty doing errands alone such as visiting a doctor's office or shopping?: No    Assessment & Plan  1. Encounter for routine history and physical exam for male  Discussed importance of 150 minutes of physical activity weekly, eat  two servings of fish weekly, eat one serving of tree nuts ( cashews, pistachios, pecans, almonds.Marland Kitchen) every other day, eat 6 servings of fruit/vegetables daily and drink plenty of water and avoid sweet beverages.   2. History of back surgery  - gabapentin (NEURONTIN) 100 MG capsule; Take 1 capsule (100 mg total) by mouth daily.  Dispense: 90 capsule; Refill: 1  3. Paresthesia of left foot  - gabapentin (NEURONTIN) 100 MG capsule; Take 1 capsule (100 mg total) by mouth daily.  Dispense: 90 capsule; Refill: 1  4. Major depression in remission ([place])  - citalopram (CELEXA) 40 MG tablet; Take 1 tablet (40 mg total) by mouth daily.  Dispense: 90 tablet; Refill: 1  5. Insomnia, persistent  - traZODone (DESYREL) 100 MG tablet; Take 1 tablet (100 mg total) by mouth every evening.  Dispense: 90 tablet; Refill: 1  6. Migraine without aura and without status migrainosus, not intractable  - ondansetron (ZOFRAN ODT) 4 MG disintegrating tablet; Take 1 tablet (4 mg total) by mouth every 8 (eight) hours as needed for nausea or vomiting.  Dispense: 20 tablet; Refill: 0 - SUMAtriptan (IMITREX) 100 MG tablet; Take 1 tablet (100 mg total) by mouth every 2 (two) hours as needed for migraine. May repeat in 2 hours if headache persists or recurs.  Dispense: 10 tablet; Refill: 1  7. Screening for diabetes mellitus  Normal last year  8. Lipid screening  - Lipid panel  9. High risk medication use  - COMPLETE METABOLIC PANEL WITH GFR

## 2017-08-07 NOTE — Patient Instructions (Addendum)
Please give me a copy of hepatitis B   Preventive Care 40-64 Years, Male Preventive care refers to lifestyle choices and visits with your health care provider that can promote health and wellness. What does preventive care include?  A yearly physical exam. This is also called an annual well check.  Dental exams once or twice a year.  Routine eye exams. Ask your health care provider how often you should have your eyes checked.  Personal lifestyle choices, including: ? Daily care of your teeth and gums. ? Regular physical activity. ? Eating a healthy diet. ? Avoiding tobacco and drug use. ? Limiting alcohol use. ? Practicing safe sex. ? Taking low-dose aspirin every day starting at age 41. What happens during an annual well check? The services and screenings done by your health care provider during your annual well check will depend on your age, overall health, lifestyle risk factors, and family history of disease. Counseling Your health care provider may ask you questions about your:  Alcohol use.  Tobacco use.  Drug use.  Emotional well-being.  Home and relationship well-being.  Sexual activity.  Eating habits.  Work and work Statistician.  Screening You may have the following tests or measurements:  Height, weight, and BMI.  Blood pressure.  Lipid and cholesterol levels. These may be checked every 5 years, or more frequently if you are over 15 years old.  Skin check.  Lung cancer screening. You may have this screening every year starting at age 41 if you have a 30-pack-year history of smoking and currently smoke or have quit within the past 15 years.  Fecal occult blood test (FOBT) of the stool. You may have this test every year starting at age 41.  Flexible sigmoidoscopy or colonoscopy. You may have a sigmoidoscopy every 5 years or a colonoscopy every 10 years starting at age 41.  Prostate cancer screening. Recommendations will vary depending on your family  history and other risks.  Hepatitis C blood test.  Hepatitis B blood test.  Sexually transmitted disease (STD) testing.  Diabetes screening. This is done by checking your blood sugar (glucose) after you have not eaten for a while (fasting). You may have this done every 1-3 years.  Discuss your test results, treatment options, and if necessary, the need for more tests with your health care provider. Vaccines Your health care provider may recommend certain vaccines, such as:  Influenza vaccine. This is recommended every year.  Tetanus, diphtheria, and acellular pertussis (Tdap, Td) vaccine. You may need a Td booster every 10 years.  Varicella vaccine. You may need this if you have not been vaccinated.  Zoster vaccine. You may need this after age 41.  Measles, mumps, and rubella (MMR) vaccine. You may need at least one dose of MMR if you were born in 1957 or later. You may also need a second dose.  Pneumococcal 13-valent conjugate (PCV13) vaccine. You may need this if you have certain conditions and have not been vaccinated.  Pneumococcal polysaccharide (PPSV23) vaccine. You may need one or two doses if you smoke cigarettes or if you have certain conditions.  Meningococcal vaccine. You may need this if you have certain conditions.  Hepatitis A vaccine. You may need this if you have certain conditions or if you travel or work in places where you may be exposed to hepatitis A.  Hepatitis B vaccine. You may need this if you have certain conditions or if you travel or work in places where you may be exposed  to hepatitis B.  Haemophilus influenzae type b (Hib) vaccine. You may need this if you have certain risk factors.  Talk to your health care provider about which screenings and vaccines you need and how often you need them. This information is not intended to replace advice given to you by your health care provider. Make sure you discuss any questions you have with your health care  provider. Document Released: 09/18/2015 Document Revised: 05/11/2016 Document Reviewed: 06/23/2015 Elsevier Interactive Patient Education  2017 [employer].

## 2017-08-08 ENCOUNTER — Encounter: Payer: BLUE CROSS/BLUE SHIELD | Admitting: Family Medicine

## 2017-09-06 ENCOUNTER — Other Ambulatory Visit: Payer: Self-pay | Admitting: Family Medicine

## 2017-09-06 DIAGNOSIS — G4700 Insomnia, unspecified: Secondary | ICD-10-CM

## 2017-10-10 ENCOUNTER — Other Ambulatory Visit: Payer: Self-pay | Admitting: Family Medicine

## 2017-10-10 DIAGNOSIS — Z9889 Other specified postprocedural states: Secondary | ICD-10-CM

## 2017-10-10 DIAGNOSIS — R202 Paresthesia of skin: Secondary | ICD-10-CM

## 2017-10-11 NOTE — Telephone Encounter (Signed)
Refill request for general medication: Gabapentin 100 mg  Last office visit: 08/07/2017  Last physical exam: 08/07/2017  Follow-up on file. 02/05/2018

## 2017-10-30 ENCOUNTER — Telehealth: Payer: Self-pay

## 2017-10-30 ENCOUNTER — Other Ambulatory Visit: Payer: Self-pay | Admitting: Family Medicine

## 2017-10-30 MED ORDER — OSELTAMIVIR PHOSPHATE 75 MG PO CAPS: 75.0000 mg | ORAL_CAPSULE | Freq: Every day | ORAL | 0 refills | Status: DC

## 2017-10-30 NOTE — Progress Notes (Signed)
tamilflu

## 2017-10-30 NOTE — Telephone Encounter (Signed)
Left voice mail @ 4:44pm on [phone] informing that prescription has been sent to pharmacy.

## 2017-10-30 NOTE — Telephone Encounter (Signed)
Sent Tamiflu.

## 2017-10-30 NOTE — Telephone Encounter (Signed)
Patient daughter was positive for the Flu and would like a preventative measure to make sure he does not get the Flu as well. Wife Estill Bamberg was seen today and ask we please send in prescription with her medication.

## 2018-02-05 ENCOUNTER — Ambulatory Visit: Payer: Commercial Managed Care - PPO | Admitting: Family Medicine

## 2018-02-16 ENCOUNTER — Encounter: Payer: Self-pay | Admitting: Nurse Practitioner

## 2018-02-16 ENCOUNTER — Ambulatory Visit (INDEPENDENT_AMBULATORY_CARE_PROVIDER_SITE_OTHER): Payer: Commercial Managed Care - PPO | Admitting: Nurse Practitioner

## 2018-02-16 DIAGNOSIS — G4700 Insomnia, unspecified: Secondary | ICD-10-CM

## 2018-02-16 DIAGNOSIS — G43009 Migraine without aura, not intractable, without status migrainosus: Secondary | ICD-10-CM

## 2018-02-16 DIAGNOSIS — F325 Major depressive disorder, single episode, in full remission: Secondary | ICD-10-CM | POA: Diagnosis not present

## 2018-02-16 MED ORDER — SUMATRIPTAN SUCCINATE 100 MG PO TABS: 100.0000 mg | ORAL_TABLET | ORAL | 1 refills | Status: DC | PRN

## 2018-02-16 MED ORDER — CITALOPRAM HYDROBROMIDE 40 MG PO TABS: 40.0000 mg | ORAL_TABLET | Freq: Every day | ORAL | 1 refills | Status: DC

## 2018-02-16 MED ORDER — TRAZODONE HCL 100 MG PO TABS: 100.0000 mg | ORAL_TABLET | Freq: Every evening | ORAL | 1 refills | Status: DC

## 2018-02-16 MED ORDER — ASPIRIN-ACETAMINOPHEN-CAFFEINE 250-250-65 MG PO TABS: 1.0000 | ORAL_TABLET | Freq: Four times a day (QID) | ORAL | 0 refills | Status: DC | PRN

## 2018-02-16 NOTE — Progress Notes (Addendum)
Name: Ralph Mathews   MRN: [id number]    DOB: 21-May-1976   Date:02/16/2018       Progress Note  Subjective  Chief Complaint  Chief Complaint  Patient presents with  . Depression    6 month recheck, medication refills    HPI  Depression Has been on celexa for over 5 years. Takes it daily no missed doses. Has had counseling in the past and has been helpful but not interested in it right now. Just increased stress with daughters health   Depression screen [facility] 2/9 02/16/2018  Decreased Interest 0  Down, Depressed, Hopeless 1  PHQ - 2 Score 1  Altered sleeping 0  Tired, decreased energy 1  Change in appetite 0  Feeling bad or failure about yourself  1  Trouble concentrating 0  Moving slowly or fidgety/restless 0  Suicidal thoughts 0  PHQ-9 Score 3  Difficult doing work/chores Not difficult at all   GAD 7 : Generalized Anxiety Score 02/16/2018  Nervous, Anxious, on Edge 0  Control/stop worrying 0  Worry too much - different things 0  Trouble relaxing 1  Restless 1  Easily annoyed or irritable 2  Afraid - awful might happen 0  Total GAD 7 Score 4    Migraines 2-3 day a month is affected by migraines. sts imitrex works well for him. Endorses nausea. Denies vomiting photosensitivity.   Insomnia Gets about 6 hours of sleep, feels somewhat tired when he wakes up. No naps. Takes about 30 min to get to sleep. Takes trazodone daily.   Patient Active Problem List   Diagnosis Date Noted  . Major depression in remission ([place]) 08/05/2016  . History of back surgery 08/05/2016  . Migraine without aura and without status migrainosus, not intractable 05/25/2015  . Insomnia, persistent 04/11/2015  . Chronic constipation 04/11/2015  . Lumbosacral spondylosis without myelopathy 04/11/2015  . Circadian rhythm sleep disorder, shift work type 04/11/2015  . Bulge of lumbar disc without myelopathy 04/11/2015  . Ejaculates too soon 04/11/2015  . Perennial allergic rhinitis with seasonal  variation 04/11/2015    Past Medical History:  Diagnosis Date  . Allergy   . Depression   . DJD (degenerative joint disease)   . Insomnia   . Premature ejaculation   . Rosacea     Past Surgical History:  Procedure Laterality Date  . LAMINECTOMY  08/14/2014  . NASAL SEPTOPLASTY W/ TURBINOPLASTY  09/05/2008  . ORIF METATARSAL FRACTURE Right     Social History   Tobacco Use  . Smoking status: Never Smoker  . Smokeless tobacco: Never Used  Substance Use Topics  . Alcohol use: Yes    Alcohol/week: 3.6 oz    Types: 4 Cans of beer, 2 Standard drinks or equivalent per week    Comment: 2 mixed drinks and 4 beers a week      Current Outpatient Medications:  .  citalopram (CELEXA) 40 MG tablet, Take 1 tablet (40 mg total) by mouth daily., Disp: 90 tablet, Rfl: 1 .  SUMAtriptan (IMITREX) 100 MG tablet, Take 1 tablet (100 mg total) by mouth every 2 (two) hours as needed for migraine. May repeat in 2 hours if headache persists or recurs., Disp: 10 tablet, Rfl: 1 .  traZODone (DESYREL) 100 MG tablet, Take 1 tablet (100 mg total) by mouth every evening., Disp: 90 tablet, Rfl: 1 .  aspirin-acetaminophen-caffeine (EXCEDRIN MIGRAINE) 250-250-65 MG tablet, Take 1 tablet by mouth every 6 (six) hours as needed for headache. (Patient not taking: Reported  on 02/16/2018), Disp: 30 tablet, Rfl: 0 .  cetirizine (ZYRTEC) 10 MG tablet, Take 1 tablet (10 mg total) by mouth daily. (Patient not taking: Reported on 02/16/2018), Disp: 30 tablet, Rfl: 0 .  gabapentin (NEURONTIN) 100 MG capsule, Take 1 capsule (100 mg total) by mouth daily. (Patient not taking: Reported on 02/16/2018), Disp: 90 capsule, Rfl: 1 .  ondansetron (ZOFRAN ODT) 4 MG disintegrating tablet, Take 1 tablet (4 mg total) by mouth every 8 (eight) hours as needed for nausea or vomiting. (Patient not taking: Reported on 02/16/2018), Disp: 20 tablet, Rfl: 0 .  oseltamivir (TAMIFLU) 75 MG capsule, Take 1 capsule (75 mg total) by mouth daily. (Patient  not taking: Reported on 02/16/2018), Disp: 10 capsule, Rfl: 0  No Known Allergies  ROS  Constitutional: Negative for fever or weight change.  Respiratory: Negative for cough and shortness of breath.   Cardiovascular: Negative for chest pain or palpitations.  Gastrointestinal: Negative for abdominal pain, no bowel changes.  Musculoskeletal: Negative for gait problem or joint swelling.  Skin: Negative for rash.  Neurological: Negative for dizziness or Positive headache.  No other specific complaints in a complete review of systems (except as listed in HPI above).  Objective  Vitals:   02/16/18 1438  BP: 110/62  Pulse: 78  Resp: 16  Temp: 98.6 F (37 C)  TempSrc: Oral  SpO2: 95%  Weight: 169 lb (76.7 kg)  Height: 5\' 10"  (1.778 m)    Body mass index is 24.25 kg/m.  Nursing Note and Vital Signs reviewed.  Physical Exam   Constitutional: Patient appears well-developed and well-nourished.  No distress.  Cardiovascular: Normal rate, regular rhythm, S1/S2 present.  No murmur or rub heard.  Pulmonary/Chest: Effort normal and breath sounds clear. No respiratory distress or retractions. Neuro: alert and oriented, clear speech, gait and coordination normal  Psychiatric: Patient has a normal mood and affect. behavior is normal. Judgment and thought content normal.  No results found for this or any previous visit (from the past 72 hour(s)).  Assessment & Plan  1. Migraine without aura and without status migrainosus, not intractable -stable - aspirin-acetaminophen-caffeine (EXCEDRIN MIGRAINE) 250-250-65 MG tablet; Take 1 tablet by mouth every 6 (six) hours as needed for headache.  Dispense: 30 tablet; Refill: 0 - SUMAtriptan (IMITREX) 100 MG tablet; Take 1 tablet (100 mg total) by mouth every 2 (two) hours as needed for migraine. May repeat in 2 hours if headache persists or recurs.  Dispense: 10 tablet; Refill: 1  2. Major depression in remission ([place]) -stable - citalopram  (CELEXA) 40 MG tablet; Take 1 tablet (40 mg total) by mouth daily.  Dispense: 90 tablet; Refill: 1  3. Insomnia, persistent - discussed sleep hygiene and risks and benefits of medication  - traZODone (DESYREL) 100 MG tablet; Take 1 tablet (100 mg total) by mouth every evening.  Dispense: 90 tablet; Refill: 1   -Red flags and when to present for emergency care or RTC including fever >101.55F, chest pain, shortness of breath, new/worsening/un-resolving symptoms,  reviewed with patient at time of visit. Follow up and care instructions discussed and provided in AVS.  I have reviewed this encounter including the documentation in this note and/or discussed this patient with the provider, Suezanne Cheshire DNP AGNP-C. I am certifying that I agree with the content of this note as supervising physician. Steele Sizer, MD [facility] 02/17/2018, 5:12 PM

## 2018-02-16 NOTE — Patient Instructions (Signed)
Sleep Hygiene Tips 1) Get regular. One of the best ways to train your body to sleep well is to go to bed and get up at more or less the same time every day, even on weekends and days off! This regular rhythm will make you feel better and will give your body something to work from. 2) Sleep when sleepy. Only try to sleep when you actually feel tired or sleepy, rather than spending too much time awake in bed. 3) Get up & try again. If you haven't been able to get to sleep after about 20 minutes or more, get up and do something calming or boring until you feel sleepy, then return to bed and try again. Sit quietly on the couch with the lights off (bright light will tell your brain that it is time to wake up), or read something boring like the phone book. Avoid doing anything that is too stimulating or interesting, as this will wake you up even more. 4) Avoid caffeine & nicotine. It is best to avoid consuming any caffeine (in coffee, tea, cola drinks, chocolate, and some medications) or nicotine (cigarettes) for at least 4-6 hours before going to bed. These substances act as stimulants and interfere with the ability to fall asleep 5) Avoid alcohol. It is also best to avoid alcohol for at least 4-6 hours before going to bed. Many people believe that alcohol is relaxing and helps them to get to sleep at first, but it actually interrupts the quality of sleep. 6) Bed is for sleeping. Try not to use your bed for anything other than sleeping and sex, so that your body comes to associate bed with sleep. If you use bed as a place to watch TV, eat, read, work on your laptop, pay bills, and other things, your body will not learn this Connection. 7) No naps. It is best to avoid taking naps during the day, to make sure that you are tired at bedtime. If you can't make it through the day without a nap, make sure it is for less than an hour and before 3pm. 8) Sleep rituals. You can develop your  own rituals of things to remind your body that it is time to sleep - some people find it useful to do relaxing stretches or breathing exercises for 15 minutes before bed each night, or sit calmly with a cup of caffeine-free tea. 9) Bathtime. Having a hot bath 1-2 hours before bedtime can be useful, as it will raise your body temperature, causing you to feel sleepy as your body temperature drops again. Research shows that sleepiness is associated with a drop in body temperature. 10) No clock-watching. Many people who struggle with sleep tend to watch the clock too much. Frequently checking the clock during the night can wake you up (especially if you turn on the light to read the time) and reinforces negative thoughts such as "Oh no, look how late it is, I'll never get to sleep" or "it's so early, I have only slept for 5 hours, this is terrible." 11) Use a sleep diary. This worksheet can be a useful way of making sure you have the right facts about your sleep, rather than making assumptions. Because a diary involves watching the clock (see point 10) it is a good idea to only use it for two weeks to get an idea of what is going and then perhaps two months down the track to see how you are progressing. 12) Exercise. Regular exercise is   a good idea to help with good sleep, but try not to do strenuous exercise in the 4 hours before bedtime. Morning walks are a great way to start the day feeling refreshed! 13) Eat right. A healthy, balanced diet will help you to sleep well, but timing is important. Some people find that a very empty stomach at bedtime is distracting, so it can be useful to have a light snack, but a heavy meal soon before bed can also interrupt sleep. Some people recommend a warm glass of milk, which contains tryptophan, which acts as a natural sleep inducer. 14) The right space. It is very important that your bed and bedroom are quiet and comfortable for sleeping. A  cooler room with enough blankets to stay warm is best, and make sure you have curtains or an eyemask to block out early morning light and earplugs if there is noise outside your room. 15) Keep daytime routine the same. Even if you have a bad night sleep and are tired it is important that you try to keep your daytime activities the same as you had planned. That is, don't avoid activities because you feel tired. This can reinforce the insomnia.    

## 2018-05-17 ENCOUNTER — Other Ambulatory Visit: Payer: Self-pay

## 2018-05-18 ENCOUNTER — Other Ambulatory Visit: Payer: Self-pay | Admitting: Family Medicine

## 2018-05-18 DIAGNOSIS — R202 Paresthesia of skin: Secondary | ICD-10-CM

## 2018-05-18 DIAGNOSIS — Z9889 Other specified postprocedural states: Secondary | ICD-10-CM

## 2018-05-18 NOTE — Telephone Encounter (Signed)
Refill request for general medication: Gabapentin 100 mg  Last office visit: 02/16/2018  Last physical exam: 08/07/2017  Follow-ups on file. 08/17/2018

## 2018-05-20 NOTE — Telephone Encounter (Signed)
Please verify he is still taking it, during his last visit he reported not to be taking it and medication was discontinued by Suezanne Cheshire

## 2018-05-21 NOTE — Telephone Encounter (Signed)
Spoke with patient and he states he has quit taking Gabapentin. Please do not send to pharmacy. Thanks

## 2018-08-17 ENCOUNTER — Ambulatory Visit (INDEPENDENT_AMBULATORY_CARE_PROVIDER_SITE_OTHER): Payer: Commercial Managed Care - PPO | Admitting: Family Medicine

## 2018-08-17 ENCOUNTER — Encounter: Payer: Self-pay | Admitting: Family Medicine

## 2018-08-17 VITALS — BP 110/84 | HR 85 | Temp 98.2°F | Resp 16 | Ht 70.0 in | Wt 164.8 lb

## 2018-08-17 DIAGNOSIS — G43009 Migraine without aura, not intractable, without status migrainosus: Secondary | ICD-10-CM

## 2018-08-17 DIAGNOSIS — Z13220 Encounter for screening for lipoid disorders: Secondary | ICD-10-CM

## 2018-08-17 DIAGNOSIS — Z0000 Encounter for general adult medical examination without abnormal findings: Secondary | ICD-10-CM | POA: Diagnosis not present

## 2018-08-17 DIAGNOSIS — G4700 Insomnia, unspecified: Secondary | ICD-10-CM

## 2018-08-17 DIAGNOSIS — F325 Major depressive disorder, single episode, in full remission: Secondary | ICD-10-CM

## 2018-08-17 DIAGNOSIS — Z1159 Encounter for screening for other viral diseases: Secondary | ICD-10-CM

## 2018-08-17 DIAGNOSIS — D229 Melanocytic nevi, unspecified: Secondary | ICD-10-CM

## 2018-08-17 DIAGNOSIS — Z131 Encounter for screening for diabetes mellitus: Secondary | ICD-10-CM | POA: Diagnosis not present

## 2018-08-17 DIAGNOSIS — Z79899 Other long term (current) drug therapy: Secondary | ICD-10-CM

## 2018-08-17 MED ORDER — TRAZODONE HCL 100 MG PO TABS: 100.0000 mg | ORAL_TABLET | Freq: Every evening | ORAL | 1 refills | Status: DC

## 2018-08-17 MED ORDER — SUMATRIPTAN SUCCINATE 100 MG PO TABS: 100.0000 mg | ORAL_TABLET | ORAL | 1 refills | Status: DC | PRN

## 2018-08-17 MED ORDER — CITALOPRAM HYDROBROMIDE 40 MG PO TABS: 40.0000 mg | ORAL_TABLET | Freq: Every day | ORAL | 1 refills | Status: DC

## 2018-08-17 NOTE — Progress Notes (Signed)
Name: Ralph Mathews   MRN: [id number]    DOB: 07-19-1976   Date:08/17/2018       Progress Note  Subjective  Chief Complaint  Chief Complaint  Patient presents with  . Annual Exam    HPI  Patient presents for annual CPE and follow up.  History of back surgery: back in 2015 by Dr. Johnna Acosta had some recurrence of radiculitis on right side starting 08/2016 but doing better now, episodes are random and mild now.   Depression: taking Citalopram for many years No side effects of medication. He feels like he should be able to provide more financially for his family. He likes his job.  He denies suicidal thoughts, anhedonia or change in appetite.  Migraine Headaches: he has episodes of left temporal area pain, described as starting as a throbbing sensation and sometimes radiates to frontal area, at times it is severe, has phonophobia and photophobia, occasionally associated with nausea or vomiting. Symptoms started many years ago and resolves with Excedrin otc and Imitrex, having more nausea lately, episodes still about twice per month.   Insomnia: he has been taking Trazodone and is able to fall and stay asleep most nights, for 6-7 hours per night.No side effects of medication  Diet: 3 meals a day, he eats breakfast and dinner at home, eats at work usually a sandwich or salad for lunch, he has 5 servings of fruit and vegetables per day Exercise: he goes to the gym on a regular basis  Depression:  Depression screen [facility] 2/9 08/17/2018 02/16/2018 08/07/2017 03/06/2017 08/05/2016  Decreased Interest 0 0 0 0 0  Down, Depressed, Hopeless 0 1 0 0 0  PHQ - 2 Score 0 1 0 0 0  Altered sleeping 0 0 - - -  Tired, decreased energy 1 1 - - -  Change in appetite 0 0 - - -  Feeling bad or failure about yourself  1 1 - - -  Trouble concentrating 0 0 - - -  Moving slowly or fidgety/restless 0 0 - - -  Suicidal thoughts 0 0 - - -  PHQ-9 Score 2 3 - - -  Difficult doing work/chores Not difficult at  all Not difficult at all - - -    Hypertension:  BP Readings from Last 3 Encounters:  08/17/18 110/84  02/16/18 110/62  08/07/17 116/80    Obesity: Wt Readings from Last 3 Encounters:  08/17/18 164 lb 12.8 oz (74.8 kg)  02/16/18 169 lb (76.7 kg)  08/07/17 166 lb (75.3 kg)   BMI Readings from Last 3 Encounters:  08/17/18 23.65 kg/m  02/16/18 24.25 kg/m  08/07/17 23.82 kg/m     Lipids:  Lab Results  Component Value Date   CHOL 181 08/07/2017   CHOL 179 08/05/2016   CHOL 163 11/18/2014   Lab Results  Component Value Date   HDL 67 08/07/2017   HDL 65 08/05/2016   HDL 67 11/18/2014   Lab Results  Component Value Date   LDLCALC 94 08/07/2017   LDLCALC 96 08/05/2016   LDLCALC 81 11/18/2014   Lab Results  Component Value Date   TRIG 106 08/07/2017   TRIG 91 08/05/2016   TRIG 77 11/18/2014   Lab Results  Component Value Date   CHOLHDL 2.7 08/07/2017   CHOLHDL 2.8 08/05/2016   No results found for: LDLDIRECT Glucose:  Glucose, Bld  Date Value Ref Range Status  08/07/2017 90 65 - 99 mg/dL Final    Comment:    .  Fasting reference interval .   08/05/2016 94 65 - 99 mg/dL Final      Office Visit from 08/17/2018 in [facility]  AUDIT-C Score  3      Married STD testing and prevention (HIV/chl/gon/syphilis): not interested  Hep C: today   Skin cancer: discussed atypical lesion Colorectal cancer: start at age 17 Prostate cancer: N/A  IPSS Questionnaire (AUA-7): Over the past month.   1)  How often have you had a sensation of not emptying your bladder completely after you finish urinating?  1 - Less than 1 time in 5  2)  How often have you had to urinate again less than two hours after you finished urinating? 1 - Less than 1 time in 5  3)  How often have you found you stopped and started again several times when you urinated?  0 - Not at all  4) How difficult have you found it to postpone urination?  0 - Not at all   5) How often have you had a weak urinary stream?  0 - Not at all  6) How often have you had to push or strain to begin urination?  0 - Not at all  7) How many times did you most typically get up to urinate from the time you went to bed until the time you got up in the morning?  0 - None  Total score:  0-7 mildly symptomatic   8-19 moderately symptomatic   20-35 severely symptomatic     Advanced Care Planning: A voluntary discussion about advance care planning including the explanation and discussion of advance directives.  Discussed health care proxy and Living will, and the patient was able to identify a health care proxy as wife.   Patient does not have a living will at present time. If patient does have living will, I have requested they bring this to the clinic to be scanned in to their chart.  Patient Active Problem List   Diagnosis Date Noted  . Major depression in remission ([place]) 08/05/2016  . History of back surgery 08/05/2016  . Migraine without aura and without status migrainosus, not intractable 05/25/2015  . Insomnia, persistent 04/11/2015  . Chronic constipation 04/11/2015  . Lumbosacral spondylosis without myelopathy 04/11/2015  . Circadian rhythm sleep disorder, shift work type 04/11/2015  . Ejaculates too soon 04/11/2015  . Perennial allergic rhinitis with seasonal variation 04/11/2015    Past Surgical History:  Procedure Laterality Date  . LAMINECTOMY  08/14/2014  . NASAL SEPTOPLASTY W/ TURBINOPLASTY  09/05/2008  . ORIF METATARSAL FRACTURE Right     Family History  Problem Relation Age of Onset  . Depression Mother   . Hypertension Mother   . Obesity Mother   . Anxiety disorder Father     Social History   Socioeconomic History  . Marital status: Married    Spouse name: Estill Bamberg  . Number of children: 4  . Years of education: 3  . Highest education level: Bachelor's degree (e.g., BA, AB, BS)  Occupational History  . Occupation: Training and development officer   Social Needs  .  Financial resource strain: Somewhat hard  . Food insecurity:    Worry: Never true    Inability: Never true  . Transportation needs:    Medical: No    Non-medical: No  Tobacco Use  . Smoking status: Never Smoker  . Smokeless tobacco: Never Used  Substance and Sexual Activity  . Alcohol use: Yes    Alcohol/week: 6.0 standard  drinks    Types: 4 Cans of beer, 2 Standard drinks or equivalent per week    Comment: 2 mixed drinks and 4 beers a week   . Drug use: No    Comment: experimented with marijuana as a teenager  . Sexual activity: Yes    Partners: Female    Birth control/protection: Other-see comments    Comment: wife had an ablation  Lifestyle  . Physical activity:    Days per week: 2 days    Minutes per session: 90 min  . Stress: Only a little  Relationships  . Social connections:    Talks on phone: Three times a week    Gets together: Never    Attends religious service: 1 to 4 times per year    Active member of club or organization: No    Attends meetings of clubs or organizations: Never    Relationship status: Married  . Intimate partner violence:    Fear of current or ex partner: No    Emotionally abused: No    Physically abused: No    Forced sexual activity: No  Other Topics Concern  . Not on file  Social History Narrative   Married, has 4 daughters, mother-in-law helps them out ( taking care of children and financially )      Current Outpatient Medications:  .  aspirin-acetaminophen-caffeine (EXCEDRIN MIGRAINE) 250-250-65 MG tablet, Take 1 tablet by mouth every 6 (six) hours as needed for headache., Disp: 30 tablet, Rfl: 0 .  citalopram (CELEXA) 40 MG tablet, Take 1 tablet (40 mg total) by mouth daily., Disp: 90 tablet, Rfl: 1 .  SUMAtriptan (IMITREX) 100 MG tablet, Take 1 tablet (100 mg total) by mouth every 2 (two) hours as needed for migraine. May repeat in 2 hours if headache persists or recurs., Disp: 10 tablet, Rfl: 1 .  traZODone (DESYREL) 100 MG tablet,  Take 1 tablet (100 mg total) by mouth every evening., Disp: 90 tablet, Rfl: 1 .  cetirizine (ZYRTEC) 10 MG tablet, Take 1 tablet (10 mg total) by mouth daily. (Patient not taking: Reported on 08/17/2018), Disp: 30 tablet, Rfl: 0  No Known Allergies   ROS  Constitutional: Negative for fever or weight change.  Respiratory: Negative for cough and shortness of breath.   Cardiovascular: Negative for chest pain or palpitations.  Gastrointestinal: Negative for abdominal pain, no bowel changes.  Musculoskeletal: Negative for gait problem or joint swelling.  Skin: Negative for rash. Change in mole on right arm Neurological: Negative for dizziness or headache.  No other specific complaints in a complete review of systems (except as listed in HPI above).  Objective  Vitals:   08/17/18 0819  BP: 110/84  Pulse: 85  Resp: 16  Temp: 98.2 F (36.8 C)  TempSrc: Oral  SpO2: 99%  Weight: 164 lb 12.8 oz (74.8 kg)  Height: 5\' 10"  (1.778 m)    Body mass index is 23.65 kg/m.  Physical Exam  Constitutional: Patient appears well-developed and well-nourished. No distress.  HENT: Head: Normocephalic and atraumatic. Ears: B TMs ok, no erythema or effusion; Nose: Nose normal. Mouth/Throat: Oropharynx is clear and moist. No oropharyngeal exudate.  Eyes: Conjunctivae and EOM are normal. Pupils are equal, round, and reactive to light. No scleral icterus.  Neck: Normal range of motion. Neck supple. No JVD present. No thyromegaly present.  Cardiovascular: Normal rate, regular rhythm and normal heart sounds.  No murmur heard. No BLE edema. Pulmonary/Chest: Effort normal and breath sounds normal. No respiratory distress.  Abdominal: Soft. Bowel sounds are normal, no distension. There is no tenderness. no masses MALE GENITALIA: Normal descended testes bilaterally, no masses palpated, no hernias, no lesions, no discharge RECTAL:not done  Musculoskeletal: Normal range of motion, no joint effusions. No gross  deformities Neurological: he is alert and oriented to person, place, and time. No cranial nerve deficit. Coordination, balance, strength, speech and gait are normal.  Skin: Skin is warm and dry. No rash noted. No erythema. He has a growing mole on right arm Psychiatric: Patient has a normal mood and affect. behavior is normal. Judgment and thought content normal.  PHQ2/9: Depression screen [facility] 2/9 08/17/2018 02/16/2018 08/07/2017 03/06/2017 08/05/2016  Decreased Interest 0 0 0 0 0  Down, Depressed, Hopeless 0 1 0 0 0  PHQ - 2 Score 0 1 0 0 0  Altered sleeping 0 0 - - -  Tired, decreased energy 1 1 - - -  Change in appetite 0 0 - - -  Feeling bad or failure about yourself  1 1 - - -  Trouble concentrating 0 0 - - -  Moving slowly or fidgety/restless 0 0 - - -  Suicidal thoughts 0 0 - - -  PHQ-9 Score 2 3 - - -  Difficult doing work/chores Not difficult at all Not difficult at all - - -     Fall Risk: Fall Risk  08/17/2018 08/07/2017 03/06/2017 08/05/2016 02/02/2016  Falls in the past year? 0 No No No No  Number falls in past yr: 0 - - - -  Injury with Fall? 0 - - - -     Functional Status Survey: Is the patient deaf or have difficulty hearing?: No Does the patient have difficulty seeing, even when wearing glasses/contacts?: Yes(glasses) Does the patient have difficulty concentrating, remembering, or making decisions?: No Does the patient have difficulty walking or climbing stairs?: No Does the patient have difficulty dressing or bathing?: No Does the patient have difficulty doing errands alone such as visiting a doctor's office or shopping?: No    Assessment & Plan  1. Encounter for routine history and physical exam for male  - COMPLETE METABOLIC PANEL WITH GFR - Lipid panel - Hemoglobin A1c  2. Major depression in remission (HCC)  - citalopram (CELEXA) 40 MG tablet; Take 1 tablet (40 mg total) by mouth daily.  Dispense: 90 tablet; Refill: 1  3. Screening for diabetes  mellitus  - Hemoglobin A1c  4. Lipid screening  - Lipid panel  5. Insomnia, persistent  - traZODone (DESYREL) 100 MG tablet; Take 1 tablet (100 mg total) by mouth every evening.  Dispense: 90 tablet; Refill: 1  6. High risk medication use  - CBC with Differential/Platelet - COMPLETE METABOLIC PANEL WITH GFR  7. Migraine without aura and without status migrainosus, not intractable  - SUMAtriptan (IMITREX) 100 MG tablet; Take 1 tablet (100 mg total) by mouth every 2 (two) hours as needed for migraine. May repeat in 2 hours if headache persists or recurs.  Dispense: 10 tablet; Refill: 1   8. Need for hepatitis C screening test  - Hepatitis C Antibody  9. Change in skin mole  - Ambulatory referral to Dermatology  -Prostate cancer screening and PSA options (with potential risks and benefits of testing vs not testing) were discussed along with recent recs/guidelines. -USPSTF grade A and B recommendations reviewed with patient; age-appropriate recommendations, preventive care, screening tests, etc discussed and encouraged; healthy living encouraged; see AVS for patient education given to patient -Discussed  importance of 150 minutes of physical activity weekly, eat two servings of fish weekly, eat one serving of tree nuts ( cashews, pistachios, pecans, almonds.Marland Kitchen) every other day, eat 6 servings of fruit/vegetables daily and drink plenty of water and avoid sweet beverages.

## 2018-08-17 NOTE — Patient Instructions (Signed)

## 2018-08-18 LAB — COMPLETE METABOLIC PANEL WITH GFR
AG Ratio: 1.8 (calc) (ref 1.0–2.5)
ALT: 16 U/L (ref 9–46)
AST: 20 U/L (ref 10–40)
Albumin: 4.2 g/dL (ref 3.6–5.1)
Alkaline phosphatase (APISO): 56 U/L (ref 40–115)
BUN: 15 mg/dL (ref 7–25)
CO2: 29 mmol/L (ref 20–32)
Calcium: 9.3 mg/dL (ref 8.6–10.3)
Chloride: 106 mmol/L (ref 98–110)
Creat: 1.08 mg/dL (ref 0.60–1.35)
GFR, Est African American: 98 mL/min/{1.73_m2} (ref >=60)
GFR, Est Non African American: 84 mL/min/{1.73_m2} (ref >=60)
Globulin: 2.4 g/dL (calc) (ref 1.9–3.7)
Glucose, Bld: 63 mg/dL — ABNORMAL LOW (ref 65–99)
Potassium: 4.2 mmol/L (ref 3.5–5.3)
Sodium: 141 mmol/L (ref 135–146)
Total Bilirubin: 0.7 mg/dL (ref 0.2–1.2)
Total Protein: 6.6 g/dL (ref 6.1–8.1)

## 2018-08-18 LAB — CBC WITH DIFFERENTIAL/PLATELET
Basophils Absolute: 20 cells/uL (ref 0–200)
Basophils Relative: 0.5 %
Eosinophils Absolute: 88 cells/uL (ref 15–500)
Eosinophils Relative: 2.2 %
HCT: 48.3 % (ref 38.5–50.0)
Hemoglobin: 15.8 g/dL (ref 13.2–17.1)
Lymphs Abs: 1296 cells/uL (ref 850–3900)
MCH: 29.7 pg (ref 27.0–33.0)
MCHC: 32.7 g/dL (ref 32.0–36.0)
MCV: 90.8 fL (ref 80.0–100.0)
MPV: 9.4 fL (ref 7.5–12.5)
Monocytes Relative: 11.1 %
Neutro Abs: 2152 cells/uL (ref 1500–7800)
Neutrophils Relative %: 53.8 %
Platelets: 241 10*3/uL (ref 140–400)
RBC: 5.32 10*6/uL (ref 4.20–5.80)
RDW: 12.9 % (ref 11.0–15.0)
Total Lymphocyte: 32.4 %
WBC mixed population: 444 cells/uL (ref 200–950)
WBC: 4 10*3/uL (ref 3.8–10.8)

## 2018-08-18 LAB — HEMOGLOBIN A1C
Hgb A1c MFr Bld: 5.3 % of total Hgb (ref <5.7)
Mean Plasma Glucose: 105 (calc)
eAG (mmol/L): 5.8 (calc)

## 2018-08-18 LAB — LIPID PANEL
Cholesterol: 167 mg/dL (ref <200)
HDL: 63 mg/dL (ref >40)
LDL Cholesterol (Calc): 87 mg/dL (calc)
Non-HDL Cholesterol (Calc): 104 mg/dL (calc) (ref <130)
Total CHOL/HDL Ratio: 2.7 (calc) (ref <5.0)
Triglycerides: 78 mg/dL (ref <150)

## 2018-08-18 LAB — HEPATITIS C ANTIBODY
Hepatitis C Ab: NONREACTIVE
SIGNAL TO CUT-OFF: 0.03 (ref <1.00)

## 2018-12-13 ENCOUNTER — Ambulatory Visit (INDEPENDENT_AMBULATORY_CARE_PROVIDER_SITE_OTHER): Payer: Commercial Managed Care - PPO | Admitting: Family Medicine

## 2018-12-13 ENCOUNTER — Encounter: Payer: Self-pay | Admitting: Family Medicine

## 2018-12-13 ENCOUNTER — Other Ambulatory Visit: Payer: Self-pay

## 2018-12-13 DIAGNOSIS — H1013 Acute atopic conjunctivitis, bilateral: Secondary | ICD-10-CM | POA: Diagnosis not present

## 2018-12-13 MED ORDER — OLOPATADINE HCL 0.1 % OP SOLN: 1.0000 [drp] | Freq: Two times a day (BID) | OPHTHALMIC | 2 refills | Status: DC

## 2018-12-13 MED ORDER — MONTELUKAST SODIUM 10 MG PO TABS: 10.0000 mg | ORAL_TABLET | Freq: Every day | ORAL | 1 refills | Status: DC

## 2018-12-13 NOTE — Progress Notes (Signed)
Name: Ralph Mathews   MRN: [id number]    DOB: [birth year]   Date:12/13/2018       Progress Note  Subjective  Chief Complaint  Chief Complaint  Patient presents with  . Allergic Rhinitis     itching, oozing, red eyes x 1 week, gradually worsening over the past 2 weeks. OTC eye drops with no improvement.    I connected with@ on 12/13/18 at 10:00 AM EDT by a video enabled telemedicine application and verified that I am speaking with the correct person using two identifiers.  I discussed the limitations of evaluation and management by telemedicine and the availability of in person appointments. The patient expressed understanding and agreed to proceed. Staff also discussed with the patient that there may be a patient responsible charge related to this service. Patient Location: at work Provider Location: [facility]   HPI  Conjunctivitis: he has a history of AR and states symptoms are much worse. He has noticed mild nose congestin and drainage, but his eyes are really bothering him. Itchy, some photophobia but not blurred vision. He keeps rubbing his eyes and is now waking up with crusty lids. No blurred vision. He denies fever, chills, body aches, facial pressure. He has tried visine without much help and states rx eye drops has helped him in the past. He is currently taking Zyrtec. Explained that he can take it twice daily, we will add singulair and rx eye drops but if no improvement advised him to contact us back, since the exam is limited due to virtual visit  Patient Active Problem List   Diagnosis Date Noted  . Major depression in remission ([place]) 08/05/2016  . History of back surgery 08/05/2016  . Migraine without aura and without status migrainosus, not intractable 05/25/2015  . Insomnia, persistent 04/11/2015  . Chronic constipation 04/11/2015  . Lumbosacral spondylosis without myelopathy 04/11/2015  . Circadian rhythm sleep disorder, shift work type 04/11/2015  .  Ejaculates too soon 04/11/2015  . Perennial allergic rhinitis with seasonal variation 04/11/2015    Past Surgical History:  Procedure Laterality Date  . LAMINECTOMY  08/14/2014  . NASAL SEPTOPLASTY W/ TURBINOPLASTY  09/05/2008  . ORIF METATARSAL FRACTURE Right     Family History  Problem Relation Age of Onset  . Depression Mother   . Hypertension Mother   . Obesity Mother   . Anxiety disorder Father     Social History   Socioeconomic History  . Marital status: Married    Spouse name: Estill Bamberg  . Number of children: 4  . Years of education: 64  . Highest education level: Bachelor's degree (e.g., BA, AB, BS)  Occupational History  . Occupation: Training and development officer   Social Needs  . Financial resource strain: Somewhat hard  . Food insecurity:    Worry: Never true    Inability: Never true  . Transportation needs:    Medical: No    Non-medical: No  Tobacco Use  . Smoking status: Never Smoker  . Smokeless tobacco: Never Used  Substance and Sexual Activity  . Alcohol use: Yes    Alcohol/week: 6.0 standard drinks    Types: 4 Cans of beer, 2 Standard drinks or equivalent per week    Comment: 2 mixed drinks and 4 beers a week   . Drug use: No    Comment: experimented with marijuana as a teenager  . Sexual activity: Yes    Partners: Female    Birth control/protection: Other-see comments    Comment: wife had  an ablation  Lifestyle  . Physical activity:    Days per week: 2 days    Minutes per session: 90 min  . Stress: Only a little  Relationships  . Social connections:    Talks on phone: Three times a week    Gets together: Never    Attends religious service: 1 to 4 times per year    Active member of club or organization: No    Attends meetings of clubs or organizations: Never    Relationship status: Married  . Intimate partner violence:    Fear of current or ex partner: No    Emotionally abused: No    Physically abused: No    Forced sexual activity: No  Other Topics Concern  .  Not on file  Social History Narrative   Married, has 4 daughters, mother-in-law helps them out ( taking care of children and financially )      Current Outpatient Medications:  .  aspirin-acetaminophen-caffeine (EXCEDRIN MIGRAINE) 250-250-65 MG tablet, Take 1 tablet by mouth every 6 (six) hours as needed for headache., Disp: 30 tablet, Rfl: 0 .  cetirizine (ZYRTEC) 10 MG tablet, Take 1 tablet (10 mg total) by mouth daily., Disp: 30 tablet, Rfl: 0 .  citalopram (CELEXA) 40 MG tablet, Take 1 tablet (40 mg total) by mouth daily., Disp: 90 tablet, Rfl: 1 .  SUMAtriptan (IMITREX) 100 MG tablet, Take 1 tablet (100 mg total) by mouth every 2 (two) hours as needed for migraine. May repeat in 2 hours if headache persists or recurs., Disp: 10 tablet, Rfl: 1 .  traZODone (DESYREL) 100 MG tablet, Take 1 tablet (100 mg total) by mouth every evening., Disp: 90 tablet, Rfl: 1  No Known Allergies  I personally reviewed active problem list, medication list, allergies, family history, social history with the patient/caregiver today.   ROS  Ten systems reviewed and is negative except as mentioned in HPI   Objective  Virtual encounter, vitals not obtained.  There is no height or weight on file to calculate BMI.  Physical Exam  Awake , alert Eye lids puffy and slightly erythematous, squinting eyes ( standing outside his work), no drainage noticed   PHQ2/9: Depression screen [facility] 2/9 12/13/2018 08/17/2018 02/16/2018 08/07/2017 03/06/2017  Decreased Interest 0 0 0 0 0  Down, Depressed, Hopeless 0 0 1 0 0  PHQ - 2 Score 0 0 1 0 0  Altered sleeping 0 0 0 - -  Tired, decreased energy 0 1 1 - -  Change in appetite 0 0 0 - -  Feeling bad or failure about yourself  0 1 1 - -  Trouble concentrating 0 0 0 - -  Moving slowly or fidgety/restless 0 0 0 - -  Suicidal thoughts 0 0 0 - -  PHQ-9 Score 0 2 3 - -  Difficult doing work/chores - Not difficult at all Not difficult at all - -   PHQ-2/9 Result is  negative.    Fall Risk: Fall Risk  12/13/2018 08/17/2018 08/07/2017 03/06/2017 08/05/2016  Falls in the past year? 0 0 No No No  Number falls in past yr: 0 0 - - -  Injury with Fall? 0 0 - - -     Assessment & Plan  1. Allergic conjunctivitis of both eyes  - montelukast (SINGULAIR) 10 MG tablet; Take 1 tablet (10 mg total) by mouth at bedtime.  Dispense: 30 tablet; Refill: 1 - olopatadine (PATANOL) 0.1 % ophthalmic solution; Place 1 drop into  both eyes 2 (two) times daily.  Dispense: 5 mL; Refill: 2  I discussed the assessment and treatment plan with the patient. The patient was provided an opportunity to ask questions and all were answered. The patient agreed with the plan and demonstrated an understanding of the instructions.  The patient was advised to call back or seek an in-person evaluation if the symptoms worsen or if the condition fails to improve as anticipated.  I provided 15  minutes of non-face-to-face time during this encounter.

## 2018-12-17 ENCOUNTER — Other Ambulatory Visit: Payer: Self-pay | Admitting: Family Medicine

## 2018-12-17 DIAGNOSIS — G43009 Migraine without aura, not intractable, without status migrainosus: Secondary | ICD-10-CM

## 2018-12-17 NOTE — Telephone Encounter (Signed)
Refill request for general medication: Imitrex 100 mg  Last office visit: 12/13/2018  Follow-ups on file. 02/26/2019

## 2018-12-27 ENCOUNTER — Other Ambulatory Visit: Payer: Self-pay | Admitting: Family Medicine

## 2018-12-27 DIAGNOSIS — H1013 Acute atopic conjunctivitis, bilateral: Secondary | ICD-10-CM

## 2019-02-26 ENCOUNTER — Other Ambulatory Visit: Payer: Self-pay

## 2019-02-26 ENCOUNTER — Encounter: Payer: Self-pay | Admitting: Family Medicine

## 2019-02-26 ENCOUNTER — Ambulatory Visit: Payer: Commercial Managed Care - PPO | Admitting: Family Medicine

## 2019-02-26 VITALS — BP 132/86 | HR 71 | Temp 97.8°F | Resp 16 | Ht 70.0 in | Wt 166.3 lb

## 2019-02-26 DIAGNOSIS — G4700 Insomnia, unspecified: Secondary | ICD-10-CM | POA: Diagnosis not present

## 2019-02-26 DIAGNOSIS — H1013 Acute atopic conjunctivitis, bilateral: Secondary | ICD-10-CM

## 2019-02-26 DIAGNOSIS — G43009 Migraine without aura, not intractable, without status migrainosus: Secondary | ICD-10-CM

## 2019-02-26 DIAGNOSIS — Z9889 Other specified postprocedural states: Secondary | ICD-10-CM

## 2019-02-26 DIAGNOSIS — F325 Major depressive disorder, single episode, in full remission: Secondary | ICD-10-CM

## 2019-02-26 MED ORDER — CITALOPRAM HYDROBROMIDE 40 MG PO TABS: 40.0000 mg | ORAL_TABLET | Freq: Every day | ORAL | 1 refills | Status: DC

## 2019-02-26 MED ORDER — UBRELVY 100 MG PO TABS: 1.0000 | ORAL_TABLET | Freq: Every day | ORAL | 2 refills | Status: DC | PRN

## 2019-02-26 MED ORDER — TRAZODONE HCL 100 MG PO TABS: 100.0000 mg | ORAL_TABLET | Freq: Every evening | ORAL | 1 refills | Status: DC

## 2019-02-26 MED ORDER — BUPROPION HCL ER (XL) 150 MG PO TB24: 150.0000 mg | ORAL_TABLET | Freq: Every day | ORAL | 1 refills | Status: DC
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
## 2019-02-26 NOTE — Addendum Note (Signed)
Addended by: Steele Sizer F on: 02/26/2019 10:31 AM   Modules accepted: Orders

## 2019-03-20 ENCOUNTER — Other Ambulatory Visit: Payer: Self-pay | Admitting: Family Medicine

## 2019-03-20 DIAGNOSIS — G43009 Migraine without aura, not intractable, without status migrainosus: Secondary | ICD-10-CM

## 2019-03-22 ENCOUNTER — Other Ambulatory Visit: Payer: Self-pay | Admitting: Family Medicine

## 2019-03-22 DIAGNOSIS — F325 Major depressive disorder, single episode, in full remission: Secondary | ICD-10-CM

## 2019-08-19 ENCOUNTER — Encounter: Payer: Self-pay | Admitting: Family Medicine

## 2019-08-19 ENCOUNTER — Ambulatory Visit (INDEPENDENT_AMBULATORY_CARE_PROVIDER_SITE_OTHER): Payer: Commercial Managed Care - PPO | Admitting: Family Medicine

## 2019-08-19 ENCOUNTER — Other Ambulatory Visit: Payer: Self-pay

## 2019-08-19 VITALS — BP 120/70 | HR 92 | Temp 97.3°F | Resp 16 | Ht 70.0 in | Wt 163.7 lb

## 2019-08-19 DIAGNOSIS — G43009 Migraine without aura, not intractable, without status migrainosus: Secondary | ICD-10-CM | POA: Diagnosis not present

## 2019-08-19 DIAGNOSIS — Z0000 Encounter for general adult medical examination without abnormal findings: Secondary | ICD-10-CM

## 2019-08-19 DIAGNOSIS — F325 Major depressive disorder, single episode, in full remission: Secondary | ICD-10-CM | POA: Diagnosis not present

## 2019-08-19 DIAGNOSIS — G4700 Insomnia, unspecified: Secondary | ICD-10-CM | POA: Diagnosis not present

## 2019-08-19 DIAGNOSIS — Z13220 Encounter for screening for lipoid disorders: Secondary | ICD-10-CM

## 2019-08-19 DIAGNOSIS — M5412 Radiculopathy, cervical region: Secondary | ICD-10-CM

## 2019-08-19 DIAGNOSIS — Z9889 Other specified postprocedural states: Secondary | ICD-10-CM

## 2019-08-19 DIAGNOSIS — Z131 Encounter for screening for diabetes mellitus: Secondary | ICD-10-CM

## 2019-08-19 DIAGNOSIS — Z79899 Other long term (current) drug therapy: Secondary | ICD-10-CM

## 2019-08-19 MED ORDER — SUMATRIPTAN SUCCINATE 100 MG PO TABS: 100.0000 mg | ORAL_TABLET | ORAL | 2 refills | Status: DC | PRN

## 2019-08-19 MED ORDER — BUPROPION HCL ER (XL) 150 MG PO TB24: 150.0000 mg | ORAL_TABLET | Freq: Every day | ORAL | 1 refills | Status: DC

## 2019-08-19 MED ORDER — BACLOFEN 10 MG PO TABS: 10.0000 mg | ORAL_TABLET | Freq: Three times a day (TID) | ORAL | 0 refills | Status: DC

## 2019-08-19 MED ORDER — TRAZODONE HCL 100 MG PO TABS: 100.0000 mg | ORAL_TABLET | Freq: Every evening | ORAL | 1 refills | Status: DC

## 2019-08-19 MED ORDER — METHYLPREDNISOLONE 4 MG PO TBPK: ORAL_TABLET | ORAL | 0 refills | Status: DC

## 2019-08-19 MED ORDER — CITALOPRAM HYDROBROMIDE 40 MG PO TABS: 40.0000 mg | ORAL_TABLET | Freq: Every day | ORAL | 1 refills | Status: DC

## 2019-08-19 MED ORDER — UBRELVY 100 MG PO TABS: 1.0000 | ORAL_TABLET | Freq: Every day | ORAL | 2 refills | Status: DC | PRN

## 2019-08-19 NOTE — Patient Instructions (Signed)
Preventive Care 41-43 Years Old, Male Preventive care refers to lifestyle choices and visits with your health care provider that can promote health and wellness. This includes:  A yearly physical exam. This is also called an annual well check.  Regular dental and eye exams.  Immunizations.  Screening for certain conditions.  Healthy lifestyle choices, such as eating a healthy diet, getting regular exercise, not using drugs or products that contain nicotine and tobacco, and limiting alcohol use. What can I expect for my preventive care visit? Physical exam Your health care provider will check:  Height and weight. These may be used to calculate body mass index (BMI), which is a measurement that tells if you are at a healthy weight.  Heart rate and blood pressure.  Your skin for abnormal spots. Counseling Your health care provider may ask you questions about:  Alcohol, tobacco, and drug use.  Emotional well-being.  Home and relationship well-being.  Sexual activity.  Eating habits.  Work and work Statistician. What immunizations do I need?  Influenza (flu) vaccine  This is recommended every year. Tetanus, diphtheria, and pertussis (Tdap) vaccine  You may need a Td booster every 10 years. Varicella (chickenpox) vaccine  You may need this vaccine if you have not already been vaccinated. Zoster (shingles) vaccine  You may need this after age 64. Measles, mumps, and rubella (MMR) vaccine  You may need at least one dose of MMR if you were born in 1957 or later. You may also need a second dose. Pneumococcal conjugate (PCV13) vaccine  You may need this if you have certain conditions and were not previously vaccinated. Pneumococcal polysaccharide (PPSV23) vaccine  You may need one or two doses if you smoke cigarettes or if you have certain conditions. Meningococcal conjugate (MenACWY) vaccine  You may need this if you have certain conditions. Hepatitis A  vaccine  You may need this if you have certain conditions or if you travel or work in places where you may be exposed to hepatitis A. Hepatitis B vaccine  You may need this if you have certain conditions or if you travel or work in places where you may be exposed to hepatitis B. Haemophilus influenzae type b (Hib) vaccine  You may need this if you have certain risk factors. Human papillomavirus (HPV) vaccine  If recommended by your health care provider, you may need three doses over 6 months. You may receive vaccines as individual doses or as more than one vaccine together in one shot (combination vaccines). Talk with your health care provider about the risks and benefits of combination vaccines. What tests do I need? Blood tests  Lipid and cholesterol levels. These may be checked every 5 years, or more frequently if you are over 60 years old.  Hepatitis C test.  Hepatitis B test. Screening  Lung cancer screening. You may have this screening every year starting at age 43 if you have a 30-pack-year history of smoking and currently smoke or have quit within the past 15 years.  Prostate cancer screening. Recommendations will vary depending on your family history and other risks.  Colorectal cancer screening. All adults should have this screening starting at age 72 and continuing until age 2. Your health care provider may recommend screening at age 14 if you are at increased risk. You will have tests every 1-10 years, depending on your results and the type of screening test.  Diabetes screening. This is done by checking your blood sugar (glucose) after you have not eaten  for a while (fasting). You may have this done every 1-3 years.  Sexually transmitted disease (STD) testing. Follow these instructions at home: Eating and drinking  Eat a diet that includes fresh fruits and vegetables, whole grains, lean protein, and low-fat dairy products.  Take vitamin and mineral supplements as recommended  by your health care provider.  Do not drink alcohol if your health care provider tells you not to drink.  If you drink alcohol: ? Limit how much you have to 0-2 drinks a day. ? Be aware of how much alcohol is in your drink. In the [place] one drink equals one 12 oz bottle of beer (355 mL), one 5 oz glass of wine (148 mL), or one 1 oz glass of hard liquor (44 mL). Lifestyle  Take daily care of your teeth and gums.  Stay active. Exercise for at least 30 minutes on 5 or more days each week.  Do not use any products that contain nicotine or tobacco, such as cigarettes, e-cigarettes, and chewing tobacco. If you need help quitting, ask your health care provider.  If you are sexually active, practice safe sex. Use a condom or other form of protection to prevent STIs (sexually transmitted infections).  Talk with your health care provider about taking a low-dose aspirin every day starting at age 50. What's next?  Go to your health care provider once a year for a well check visit.  Ask your health care provider how often you should have your eyes and teeth checked.  Stay up to date on all vaccines. This information is not intended to replace advice given to you by your health care provider. Make sure you discuss any questions you have with your health care provider. Document Released: 09/18/2015 Document Revised: 08/16/2018 Document Reviewed: 08/16/2018 [employer]vier Patient Education  2020 [employer]   

## 2019-08-19 NOTE — Progress Notes (Signed)
Name: Ralph Mathews   MRN: [id number]    DOB: 1976-06-12   Date:08/19/2019       Progress Note  Subjective  Chief Complaint  Chief Complaint  Patient presents with  . Depression  . Migraine  . Annual Exam    HPI  Patient presents for annual CPE and follow up  History of back surgery: back in 2015 by Dr. Johnna Acosta had some recurrence of radiculitis on right side starting 08/2016 but doing better now, episodes are random and mild now.He has pain on upper back at the end of work day,hurts between shoulder blades.  Neck radiculitis: he states over the past 3 weeks he noticed pain on left shoulder described as sharp/someone sticking a needle on his posterior shoulder, no neck pain but rotating to the right and flexing neck causes the pain. Also noticing tingling on left arm ( everywhere ), he has noticed weakness on left arm - he notices when lifting weights he has noticed a difference of about 30 lbs between left and right arm.   Depression: taking Citalopram for many years we added Wellbutrin June 2020 No side effects of medication.He feels like he should be able to provide more financially for his family. He is doing better now, phq 9 negative  Migraine Headaches: he has episodes of left temporal area pain, described as starting as a throbbing sensation and sometimes radiates to frontal area, at times it is severe, has phonophobia and photophobia, occasionally associated with nausea or vomiting. Symptoms started many years ago and resolves with Excedrin otc and Imitrex, about twice per month, but some episodes can last up to 4 days . He is now taking Imitrex at onset of migraine, and usually has to take a dose of Ubrelvy, after that has to take Excedrin, but returns in 24 hours. He is usually not taking it right away.   Insomnia: he has been taking Trazodone and is able to fall and stay asleep most nights, for 5.6  hours per night, he is working from 6 am to 2 pm and is not going to  bed early enough.No side effects of medication, no recent nightmares or movement during the night   Diet: ups and down  Exercise: he walks daily for 30 minutes with his dog and twice a week he goes to the gym for 100 minutes    USPSTF grade A and B recommendations    Office Visit from 08/19/2019 in [facility]  AUDIT-C Score  3     Depression: Phq 9 is  negative Depression screen [facility] 2/9 08/19/2019 02/26/2019 12/13/2018 08/17/2018 02/16/2018  Decreased Interest 0 0 0 0 0  Down, Depressed, Hopeless 0 1 0 0 1  PHQ - 2 Score 0 1 0 0 1  Altered sleeping 1 1 0 0 0  Tired, decreased energy 1 1 0 1 1  Change in appetite 0 0 0 0 0  Feeling bad or failure about yourself  1 1 0 1 1  Trouble concentrating 0 0 0 0 0  Moving slowly or fidgety/restless 0 0 0 0 0  Suicidal thoughts 0 0 0 0 0  PHQ-9 Score 3 4 0 2 3  Difficult doing work/chores Not difficult at all Somewhat difficult - Not difficult at all Not difficult at all   Hypertension: BP Readings from Last 3 Encounters:  08/19/19 120/70  02/26/19 132/86  08/17/18 110/84   Obesity: Wt Readings from Last 3 Encounters:  08/19/19 163 lb 11.2 oz (  74.3 kg)  02/26/19 166 lb 4.8 oz (75.4 kg)  08/17/18 164 lb 12.8 oz (74.8 kg)   BMI Readings from Last 3 Encounters:  08/19/19 23.49 kg/m  02/26/19 23.86 kg/m  08/17/18 23.65 kg/m     Hep C Screening: up to date STD testing and prevention (HIV/chl/gon/syphilis): not interested  Intimate partner violence: negative screen    Skin cancer: Discussed monitoring for atypical lesions    IPSS Questionnaire (AUA-7): Over the past month.   1)  How often have you had a sensation of not emptying your bladder completely after you finish urinating?  0 - Not at all  2)  How often have you had to urinate again less than two hours after you finished urinating? 1 - Less than 1 time in 5  3)  How often have you found you stopped and started again several times when you urinated?  1  - Less than 1 time in 5  4) How difficult have you found it to postpone urination?  0 - Not at all  5) How often have you had a weak urinary stream?  0 - Not at all  6) How often have you had to push or strain to begin urination?  0 - Not at all  7) How many times did you most typically get up to urinate from the time you went to bed until the time you got up in the morning?  0 - None  Total score:  0-7 mildly symptomatic   8-19 moderately symptomatic   20-35 severely symptomatic    Advanced Care Planning: A voluntary discussion about advance care planning including the explanation and discussion of advance directives.  Discussed health care proxy and Living will, and the patient was able to identify a health care proxy as wife. Patient does not have a living will at present time. If patient does have living will, I have requested they bring this to the clinic to be scanned in to their chart.  Lipids: Lab Results  Component Value Date   CHOL 167 08/17/2018   CHOL 181 08/07/2017   CHOL 179 08/05/2016   Lab Results  Component Value Date   HDL 63 08/17/2018   HDL 67 08/07/2017   HDL 65 08/05/2016   Lab Results  Component Value Date   LDLCALC 87 08/17/2018   LDLCALC 94 08/07/2017   LDLCALC 96 08/05/2016   Lab Results  Component Value Date   TRIG 78 08/17/2018   TRIG 106 08/07/2017   TRIG 91 08/05/2016   Lab Results  Component Value Date   CHOLHDL 2.7 08/17/2018   CHOLHDL 2.7 08/07/2017   CHOLHDL 2.8 08/05/2016   No results found for: LDLDIRECT  Glucose: Glucose, Bld  Date Value Ref Range Status  08/17/2018 63 (L) 65 - 99 mg/dL Final    Comment:    .            Fasting reference interval .   08/07/2017 90 65 - 99 mg/dL Final    Comment:    .            Fasting reference interval .   08/05/2016 94 65 - 99 mg/dL Final    Patient Active Problem List   Diagnosis Date Noted  . Major depression in remission ([place]) 08/05/2016  . History of back surgery 08/05/2016   . Migraine without aura and without status migrainosus, not intractable 05/25/2015  . Insomnia, persistent 04/11/2015  . Chronic constipation 04/11/2015  . Lumbosacral spondylosis  without myelopathy 04/11/2015  . Circadian rhythm sleep disorder, shift work type 04/11/2015  . Ejaculates too soon 04/11/2015  . Perennial allergic rhinitis with seasonal variation 04/11/2015    Past Surgical History:  Procedure Laterality Date  . LAMINECTOMY  08/14/2014  . NASAL SEPTOPLASTY W/ TURBINOPLASTY  09/05/2008  . ORIF METATARSAL FRACTURE Right     Family History  Problem Relation Age of Onset  . Depression Mother   . Hypertension Mother   . Obesity Mother   . Anxiety disorder Father     Social History   Socioeconomic History  . Marital status: Married    Spouse name: Estill Bamberg  . Number of children: 4  . Years of education: 3  . Highest education level: Bachelor's degree (e.g., BA, AB, BS)  Occupational History  . Occupation: cook   Tobacco Use  . Smoking status: Never Smoker  . Smokeless tobacco: Never Used  Substance and Sexual Activity  . Alcohol use: Yes    Alcohol/week: 6.0 standard drinks    Types: 4 Cans of beer, 2 Standard drinks or equivalent per week    Comment: 2 mixed drinks and 4 beers a week   . Drug use: No    Comment: experimented with marijuana as a teenager  . Sexual activity: Yes    Partners: Female    Birth control/protection: Other-see comments    Comment: wife had an ablation  Other Topics Concern  . Not on file  Social History Narrative   Married, has 4 daughters, mother-in-law helps them out ( taking care of children and financially )    Social Determinants of Health   Financial Resource Strain: High Risk  . Difficulty of Paying Living Expenses: Hard  Food Insecurity: No Food Insecurity  . Worried About Charity fundraiser in the Last Year: Never true  . Ran Out of Food in the Last Year: Never true  Transportation Needs: No Transportation Needs  .  Lack of Transportation (Medical): No  . Lack of Transportation (Non-Medical): No  Physical Activity: Sufficiently Active  . Days of Exercise per Week: 7 days  . Minutes of Exercise per Session: 50 min  Stress: No Stress Concern Present  . Feeling of Stress : Not at all  Social Connections: Somewhat Isolated  . Frequency of Communication with Friends and Family: More than three times a week  . Frequency of Social Gatherings with Friends and Family: More than three times a week  . Attends Religious Services: Never  . Active Member of Clubs or Organizations: No  . Attends Archivist Meetings: Never  . Marital Status: Married  Human resources officer Violence: Not At Risk  . Fear of Current or Ex-Partner: No  . Emotionally Abused: No  . Physically Abused: No  . Sexually Abused: No     Current Outpatient Medications:  .  buPROPion (WELLBUTRIN XL) 150 MG 24 hr tablet, TAKE 1 TABLET BY MOUTH EVERY DAY, Disp: 90 tablet, Rfl: 1 .  cetirizine (ZYRTEC) 10 MG tablet, Take 1 tablet (10 mg total) by mouth daily., Disp: 30 tablet, Rfl: 0 .  citalopram (CELEXA) 40 MG tablet, Take 1 tablet (40 mg total) by mouth daily., Disp: 90 tablet, Rfl: 1 .  montelukast (SINGULAIR) 10 MG tablet, TAKE 1 TABLET BY MOUTH EVERYDAY AT BEDTIME, Disp: 90 tablet, Rfl: 1 .  olopatadine (PATANOL) 0.1 % ophthalmic solution, Place 1 drop into both eyes 2 (two) times daily., Disp: 5 mL, Rfl: 2 .  SUMAtriptan (IMITREX) 100 MG  tablet, TAKE 1 TABLET BY MOUTH EVERY 2 HRS AS NEEDED FOR MIGRAINE. MAY REPEAT IN 2 HRS IF HEADACHE PERSISTS OR RECURS, Disp: 9 tablet, Rfl: 2 .  traZODone (DESYREL) 100 MG tablet, Take 1 tablet (100 mg total) by mouth every evening., Disp: 90 tablet, Rfl: 1 .  Ubrogepant (UBRELVY) 100 MG TABS, Take 1 tablet by mouth daily as needed., Disp: 10 tablet, Rfl: 2 .  aspirin-acetaminophen-caffeine (EXCEDRIN MIGRAINE) 250-250-65 MG tablet, Take 1 tablet by mouth every 6 (six) hours as needed for headache.  (Patient not taking: Reported on 08/19/2019), Disp: 30 tablet, Rfl: 0  No Known Allergies   ROS  Constitutional: Negative for fever or weight change.  Respiratory: Negative for cough and shortness of breath.   Cardiovascular: Negative for chest pain or palpitations.  Gastrointestinal: Negative for abdominal pain, no bowel changes.  Musculoskeletal: Negative for gait problem or joint swelling.  Skin: Negative for rash.  Neurological: Negative for dizziness or headache.  No other specific complaints in a complete review of systems (except as listed in HPI above).  Objective  Vitals:   08/19/19 0908  BP: 120/70  Pulse: 92  Resp: 16  Temp: (!) 97.3 F (36.3 C)  TempSrc: Temporal  SpO2: 98%  Weight: 163 lb 11.2 oz (74.3 kg)  Height: 5\' 10"  (1.778 m)    Body mass index is 23.49 kg/m.  Physical Exam  Constitutional: Patient appears well-developed and well-nourished. No distress.  HENT: Head: Normocephalic and atraumatic. Ears: B TMs ok, no erythema or effusion; Nose: Nose normal. Mouth/Throat: not done Eyes: Conjunctivae and EOM are normal. Pupils are equal, round, and reactive to light. No scleral icterus.  Neck: Normal range of motion. Neck supple. No JVD present. No thyromegaly present.  Cardiovascular: Normal rate, regular rhythm and normal heart sounds.  No murmur heard. No BLE edema. Pulmonary/Chest: Effort normal and breath sounds normal. No respiratory distress. Abdominal: Soft. Bowel sounds are normal, no distension. There is no tenderness. no masses MALE GENITALIA: Normal descended testes bilaterally, no masses palpated, no hernias, no lesions, no discharge RECTAL: not done  Musculoskeletal: Normal range of motion, no joint effusions. Pain during flexion , extension and rotation to right side. Pain during palpation of over scapular area, no rashes Neurological: he is alert and oriented to person, place, and time. No cranial nerve deficit. Coordination, balance,  strength, speech and gait are normal. Normal grip Skin: Skin is warm and dry. No rash noted. No erythema.  Psychiatric: Patient has a normal mood and affect. behavior is normal. Judgment and thought content normal.  Fall Risk: Fall Risk  08/19/2019 02/26/2019 12/13/2018 08/17/2018 08/07/2017  Falls in the past year? 0 0 0 0 No  Number falls in past yr: 0 0 0 0 -  Injury with Fall? 0 0 0 0 -     Functional Status Survey: Is the patient deaf or have difficulty hearing?: No Does the patient have difficulty seeing, even when wearing glasses/contacts?: No Does the patient have difficulty concentrating, remembering, or making decisions?: No Does the patient have difficulty walking or climbing stairs?: No Does the patient have difficulty dressing or bathing?: No Does the patient have difficulty doing errands alone such as visiting a doctor's office or shopping?: No   Assessment & Plan  1. Well adult exam   2. Major depression in remission ([facility])  - buPROPion (WELLBUTRIN XL) 150 MG 24 hr tablet; Take 1 tablet (150 mg total) by mouth daily.  Dispense: 90 tablet; Refill: 1 -  citalopram (CELEXA) 40 MG tablet; Take 1 tablet (40 mg total) by mouth daily.  Dispense: 90 tablet; Refill: 1  3. Insomnia, persistent  - traZODone (DESYREL) 100 MG tablet; Take 1 tablet (100 mg total) by mouth every evening.  Dispense: 90 tablet; Refill: 1  4. Migraine without aura and without status migrainosus, not intractable  - SUMAtriptan (IMITREX) 100 MG tablet; Take 1 tablet (100 mg total) by mouth every 2 (two) hours as needed for migraine. May repeat in 2 hours if headache persists or recurs.  Dispense: 9 tablet; Refill: 2 - Ubrogepant (UBRELVY) 100 MG TABS; Take 1 tablet by mouth daily as needed.  Dispense: 10 tablet; Refill: 2  5. History of back surgery  Doing well   6. Lipid screening  Reviewed last labs, recheck yearly   7. Screening for diabetes mellitus  Reviewed labs done last year, recheck  next CPE   8. High risk medication use  Recheck next year   9. Left cervical radiculopathy  - methylPREDNISolone (MEDROL DOSEPAK) 4 MG TBPK tablet; Take as directed, start at 6 pills, than 5, 4, 3 , 2 and 1 with food  Dispense: 21 tablet; Refill: 0  If improves symptoms he can call back and we can try adding lyrica, if not we will check MRI and send him to neurosurgeon   -USPSTF grade A and B recommendations reviewed with patient; age-appropriate recommendations, preventive care, screening tests, etc discussed and encouraged; healthy living encouraged; see AVS for patient education given to patient -Discussed importance of 150 minutes of physical activity weekly, eat two servings of fish weekly, eat one serving of tree nuts ( cashews, pistachios, pecans, almonds.Marland Kitchen) every other day, eat 6 servings of fruit/vegetables daily and drink plenty of water and avoid sweet beverages.

## 2020-02-15 ENCOUNTER — Other Ambulatory Visit: Payer: Self-pay | Admitting: Family Medicine

## 2020-02-15 DIAGNOSIS — F325 Major depressive disorder, single episode, in full remission: Secondary | ICD-10-CM

## 2020-02-15 DIAGNOSIS — G4700 Insomnia, unspecified: Secondary | ICD-10-CM

## 2020-02-15 NOTE — Telephone Encounter (Signed)
Requested Prescriptions  Pending Prescriptions Disp Refills  . traZODone (DESYREL) 100 MG tablet [Pharmacy Med Name: TRAZODONE 100 MG TABLET] 90 tablet 0    Sig: TAKE 1 TABLET (100 MG TOTAL) BY MOUTH EVERY EVENING.     Psychiatry: Antidepressants - Serotonin Modulator Passed - 02/15/2020  1:11 PM      Passed - Completed PHQ-2 or PHQ-9 in the last 360 days.      Passed - Valid encounter within last 6 months    Recent Outpatient Visits          6 months ago Well adult exam   [facility] Steele Sizer, MD   11 months ago Major depression in remission [facility])   [facility] Steele Sizer, MD   1 year ago Allergic conjunctivitis of both eyes   [facility] Steele Sizer, MD   1 year ago Encounter for routine history and physical exam for male   [facility] Steele Sizer, MD   1 year ago Migraine without aura and without status migrainosus, not intractable   [place], NP      Future Appointments            In 1 week Steele Sizer, MD [place]           . citalopram (CELEXA) 40 MG tablet [Pharmacy Med Name: CITALOPRAM HBR 40 MG TABLET] 90 tablet 0    Sig: TAKE 1 TABLET BY MOUTH EVERY DAY     Psychiatry:  Antidepressants - SSRI Passed - 02/15/2020  1:11 PM      Passed - Completed PHQ-2 or PHQ-9 in the last 360 days.      Passed - Valid encounter within last 6 months    Recent Outpatient Visits          6 months ago Well adult exam   [facility] Steele Sizer, MD   11 months ago Major depression in remission [facility])   [facility] Steele Sizer, MD   1 year ago Allergic conjunctivitis of both eyes   [facility] Steele Sizer, MD   1 year ago Encounter for routine history and physical exam for male   [facility] Steele Sizer, MD   1 year  ago Migraine without aura and without status migrainosus, not intractable   [place], NP      Future Appointments            In 1 week Steele Sizer, MD [facility]

## 2020-02-17 ENCOUNTER — Ambulatory Visit: Payer: Commercial Managed Care - PPO | Admitting: Family Medicine

## 2020-02-26 ENCOUNTER — Ambulatory Visit (INDEPENDENT_AMBULATORY_CARE_PROVIDER_SITE_OTHER): Payer: Commercial Managed Care - PPO | Admitting: Family Medicine

## 2020-02-26 ENCOUNTER — Encounter: Payer: Self-pay | Admitting: Family Medicine

## 2020-02-26 VITALS — Temp 98.5°F | Ht 70.0 in

## 2020-02-26 DIAGNOSIS — H1013 Acute atopic conjunctivitis, bilateral: Secondary | ICD-10-CM

## 2020-02-26 DIAGNOSIS — G4700 Insomnia, unspecified: Secondary | ICD-10-CM | POA: Diagnosis not present

## 2020-02-26 DIAGNOSIS — G43009 Migraine without aura, not intractable, without status migrainosus: Secondary | ICD-10-CM | POA: Diagnosis not present

## 2020-02-26 DIAGNOSIS — Z9889 Other specified postprocedural states: Secondary | ICD-10-CM

## 2020-02-26 DIAGNOSIS — F325 Major depressive disorder, single episode, in full remission: Secondary | ICD-10-CM | POA: Diagnosis not present

## 2020-02-26 MED ORDER — BUPROPION HCL ER (XL) 150 MG PO TB24: 150.0000 mg | ORAL_TABLET | Freq: Every day | ORAL | 1 refills | Status: DC

## 2020-02-26 MED ORDER — TRAZODONE HCL 100 MG PO TABS: 100.0000 mg | ORAL_TABLET | Freq: Every evening | ORAL | 1 refills | Status: DC

## 2020-02-26 MED ORDER — CITALOPRAM HYDROBROMIDE 40 MG PO TABS: 40.0000 mg | ORAL_TABLET | Freq: Every day | ORAL | 1 refills | Status: DC

## 2020-02-26 MED ORDER — OLOPATADINE HCL 0.1 % OP SOLN: 1.0000 [drp] | Freq: Two times a day (BID) | OPHTHALMIC | 2 refills | Status: DC

## 2020-02-26 NOTE — Progress Notes (Signed)
Name: Ralph Mathews   MRN: [id number]    DOB: [birth year]   Date:02/26/2020       Progress Note  Subjective  Chief Complaint  Chief Complaint  Patient presents with  . Follow-up    I connected with  Kadrian Partch on 02/26/20 at  2:40 PM EDT by telephone and verified that I am speaking with the correct person using two identifiers.  I discussed the limitations, risks, security and privacy concerns of performing an evaluation and management service by telephone and the availability of in person appointments. Staff also discussed with the patient that there may be a patient responsible charge related to this service. Patient Location: at work  Provider Location: [facility]    HPI  History of back surgery: back in 2015 by Dr. Johnna Acosta had some recurrence of radiculitis on right side starting 08/2016 but doing better now, episodes are random and mild now.He has pain on upper back at the end of work day,hurts between shoulder blades, he states he has been doing well lately   Neck radiculitis: he had severe symptoms in Dec, we gave him prednisone and baclofen but he states it did not help, however eventually tingling, numbness improved, he states still has a little weakness on left side but nothing like it was   Depression: taking Citalopram for many years we added Wellbutrin June 2020 No side effects of medication.He states he feels fine at this time, not feeling as guilty lately, they paid some of their debt.  He is doing better now, phq 9 negative  Migraine Headaches: he has episodes of left temporal area pain, described as starting as a throbbing sensation and sometimes radiates to frontal area, at times it is severe, has phonophobia and photophobia, occasionally associated with nausea or vomiting. Symptoms started many years ago and resolves with Excedrin otc and Imitrex,about twice per month, but some episodes can last up to 4 days . He is now taking Imitrex at onset of migraine, and  usually has to take a dose of Ubrelvy, episode resolves within 30 minutes to one hours   Insomnia: he has been taking Trazodone and is able to fall and stay asleep most nights, for 5 -6  hours per night, he is working from 8 am until he can go home because they are short staffed at work   Patient Active Problem List   Diagnosis Date Noted  . Major depression in remission ([place]) 08/05/2016  . History of back surgery 08/05/2016  . Migraine without aura and without status migrainosus, not intractable 05/25/2015  . Insomnia, persistent 04/11/2015  . Chronic constipation 04/11/2015  . Lumbosacral spondylosis without myelopathy 04/11/2015  . Circadian rhythm sleep disorder, shift work type 04/11/2015  . Ejaculates too soon 04/11/2015  . Perennial allergic rhinitis with seasonal variation 04/11/2015    Past Surgical History:  Procedure Laterality Date  . LAMINECTOMY  08/14/2014  . NASAL SEPTOPLASTY W/ TURBINOPLASTY  09/05/2008  . ORIF METATARSAL FRACTURE Right     Family History  Problem Relation Age of Onset  . Depression Mother   . Hypertension Mother   . Obesity Mother   . Anxiety disorder Father     Social History   Socioeconomic History  . Marital status: Married    Spouse name: Estill Bamberg  . Number of children: 4  . Years of education: 47  . Highest education level: Bachelor's degree (e.g., BA, AB, BS)  Occupational History  . Occupation: cook   Tobacco Use  . Smoking  status: Never Smoker  . Smokeless tobacco: Never Used  Vaping Use  . Vaping Use: Never used  Substance and Sexual Activity  . Alcohol use: Yes    Alcohol/week: 6.0 standard drinks    Types: 4 Cans of beer, 2 Standard drinks or equivalent per week    Comment: 2 mixed drinks and 4 beers a week   . Drug use: No    Comment: experimented with marijuana as a teenager  . Sexual activity: Yes    Partners: Female    Birth control/protection: Other-see comments    Comment: wife had an ablation  Other Topics  Concern  . Not on file  Social History Narrative   Married, has 4 daughters, mother-in-law helps them out ( taking care of children and financially )    Social Determinants of Health   Financial Resource Strain: High Risk  . Difficulty of Paying Living Expenses: Hard  Food Insecurity: No Food Insecurity  . Worried About Charity fundraiser in the Last Year: Never true  . Ran Out of Food in the Last Year: Never true  Transportation Needs: No Transportation Needs  . Lack of Transportation (Medical): No  . Lack of Transportation (Non-Medical): No  Physical Activity: Sufficiently Active  . Days of Exercise per Week: 7 days  . Minutes of Exercise per Session: 50 min  Stress: No Stress Concern Present  . Feeling of Stress : Not at all  Social Connections: Moderately Isolated  . Frequency of Communication with Friends and Family: More than three times a week  . Frequency of Social Gatherings with Friends and Family: More than three times a week  . Attends Religious Services: Never  . Active Member of Clubs or Organizations: No  . Attends Archivist Meetings: Never  . Marital Status: Married  Human resources officer Violence: Not At Risk  . Fear of Current or Ex-Partner: No  . Emotionally Abused: No  . Physically Abused: No  . Sexually Abused: No     Current Outpatient Medications:  .  baclofen (LIORESAL) 10 MG tablet, Take 1 tablet (10 mg total) by mouth 3 (three) times daily., Disp: 30 each, Rfl: 0 .  buPROPion (WELLBUTRIN XL) 150 MG 24 hr tablet, Take 1 tablet (150 mg total) by mouth daily., Disp: 90 tablet, Rfl: 1 .  cetirizine (ZYRTEC) 10 MG tablet, Take 1 tablet (10 mg total) by mouth daily., Disp: 30 tablet, Rfl: 0 .  citalopram (CELEXA) 40 MG tablet, TAKE 1 TABLET BY MOUTH EVERY DAY, Disp: 90 tablet, Rfl: 0 .  olopatadine (PATANOL) 0.1 % ophthalmic solution, Place 1 drop into both eyes 2 (two) times daily., Disp: 5 mL, Rfl: 2 .  SUMAtriptan (IMITREX) 100 MG tablet, Take 1  tablet (100 mg total) by mouth every 2 (two) hours as needed for migraine. May repeat in 2 hours if headache persists or recurs., Disp: 9 tablet, Rfl: 2 .  traZODone (DESYREL) 100 MG tablet, TAKE 1 TABLET (100 MG TOTAL) BY MOUTH EVERY EVENING., Disp: 90 tablet, Rfl: 0 .  Ubrogepant (UBRELVY) 100 MG TABS, Take 1 tablet by mouth daily as needed., Disp: 10 tablet, Rfl: 2  No Known Allergies  I personally reviewed active problem list, medication list, allergies, family history, social history, health maintenance with the patient/caregiver today.   ROS  Constitutional: Negative for fever or weight change.  Respiratory: Negative for cough and shortness of breath.   Cardiovascular: Negative for chest pain or palpitations.  Gastrointestinal: Negative for abdominal pain,  no bowel changes.  Musculoskeletal: Negative for gait problem or joint swelling.  Skin: Negative for rash.  Neurological: Negative for dizziness , positive for intermittent  headache.  No other specific complaints in a complete review of systems (except as listed in HPI above).  Objective  Virtual encounter, vitals not obtained.  Body mass index is 23.49 kg/m.  Physical Exam  Awake, alert and oriented   PHQ2/9: Depression screen [facility] 2/9 02/26/2020 08/19/2019 02/26/2019 12/13/2018 08/17/2018  Decreased Interest 0 0 0 0 0  Down, Depressed, Hopeless 0 0 1 0 0  PHQ - 2 Score 0 0 1 0 0  Altered sleeping 1 1 1  0 0  Tired, decreased energy 1 1 1  0 1  Change in appetite 0 0 0 0 0  Feeling bad or failure about yourself  0 1 1 0 1  Trouble concentrating 0 0 0 0 0  Moving slowly or fidgety/restless 0 0 0 0 0  Suicidal thoughts 0 0 0 0 0  PHQ-9 Score 2 3 4  0 2  Difficult doing work/chores Not difficult at all Not difficult at all Somewhat difficult - Not difficult at all   PHQ-2/9 Result is negative.    Fall Risk: Fall Risk  02/26/2020 08/19/2019 02/26/2019 12/13/2018 08/17/2018  Falls in the past year? 0 0 0 0 0  Number falls  in past yr: 0 0 0 0 0  Injury with Fall? 0 0 0 0 0     Assessment & Plan  1. Insomnia, persistent  - traZODone (DESYREL) 100 MG tablet; Take 1 tablet (100 mg total) by mouth every evening.  Dispense: 90 tablet; Refill: 1  2. Major depression in remission ([place])  - citalopram (CELEXA) 40 MG tablet; Take 1 tablet (40 mg total) by mouth daily.  Dispense: 90 tablet; Refill: 1 - buPROPion (WELLBUTRIN XL) 150 MG 24 hr tablet; Take 1 tablet (150 mg total) by mouth daily.  Dispense: 90 tablet; Refill: 1  3. Allergic conjunctivitis of both eyes  - olopatadine (PATANOL) 0.1 % ophthalmic solution; Place 1 drop into both eyes 2 (two) times daily.  Dispense: 5 mL; Refill: 2  4. Migraine without aura and without status migrainosus, not intractable  He is doing well  5. History of back surgery   I discussed the assessment and treatment plan with the patient. The patient was provided an opportunity to ask questions and all were answered. The patient agreed with the plan and demonstrated an understanding of the instructions.   The patient was advised to call back or seek an in-person evaluation if the symptoms worsen or if the condition fails to improve as anticipated.  I provided 25 minutes of non-face-to-face time during this encounter.  Loistine Chance, MD

## 2020-08-18 ENCOUNTER — Telehealth: Payer: Self-pay | Admitting: Family Medicine

## 2020-08-18 NOTE — Telephone Encounter (Signed)
Ralph Mathews, from cover my meds, calling to speak with someone who deals in Prior authorizations. She states that it is for the pts medication, ubrelvy. Please advise.     Callback # [phone]  Refrence # [id number]

## 2020-08-26 ENCOUNTER — Other Ambulatory Visit: Payer: Self-pay | Admitting: Family Medicine

## 2020-08-26 DIAGNOSIS — G43009 Migraine without aura, not intractable, without status migrainosus: Secondary | ICD-10-CM

## 2020-08-26 DIAGNOSIS — F325 Major depressive disorder, single episode, in full remission: Secondary | ICD-10-CM

## 2020-08-26 DIAGNOSIS — G4700 Insomnia, unspecified: Secondary | ICD-10-CM

## 2020-08-26 NOTE — Telephone Encounter (Signed)
Requested medication (s) are due for refill today:   Yes  Requested medication (s) are on the active medication list:   Yes  Future visit scheduled:   Yes 11/11/2020 with Dr. Ancil Boozer   Last ordered: 08/19/2019 #9, 2 refills  Returned because pharmacy is requesting a DX CODE.     Requested Prescriptions  Pending Prescriptions Disp Refills   SUMAtriptan (IMITREX) 100 MG tablet [Pharmacy Med Name: SUMATRIPTAN SUCC 100 MG TABLET] 9 tablet 2    Sig: TAKE 1 TAB BY MOUTH EVERY 2 HOURS AS NEEDED FOR MIGRAINE. MAY REPEAT IN 2 HOURS IF HEADACHE PERSISTS      Neurology:  Migraine Therapy - Triptan Passed - 08/26/2020  3:05 PM      Passed - Last BP in normal range    BP Readings from Last 1 Encounters:  08/19/19 120/70          Passed - Valid encounter within last 12 months    Recent Outpatient Visits           6 months ago Migraine without aura and without status migrainosus, not intractable   [facility] Steele Sizer, MD   1 year ago Well adult exam   [facility] Steele Sizer, MD   1 year ago Major depression in remission [facility])   [facility] Steele Sizer, MD   1 year ago Allergic conjunctivitis of both eyes   [facility] Steele Sizer, MD   2 years ago Encounter for routine history and physical exam for male   [facility] Steele Sizer, MD       Future Appointments             In 2 months Ancil Boozer, Drue Stager, MD [facility], [facility]
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
## 2020-11-11 ENCOUNTER — Encounter: Payer: Self-pay | Admitting: Family Medicine

## 2020-11-11 ENCOUNTER — Other Ambulatory Visit: Payer: Self-pay

## 2020-11-11 ENCOUNTER — Ambulatory Visit (INDEPENDENT_AMBULATORY_CARE_PROVIDER_SITE_OTHER): Payer: Commercial Managed Care - PPO | Admitting: Family Medicine

## 2020-11-11 VITALS — BP 124/80 | HR 72 | Temp 97.7°F | Resp 16 | Ht 70.0 in | Wt 159.0 lb

## 2020-11-11 DIAGNOSIS — R0789 Other chest pain: Secondary | ICD-10-CM

## 2020-11-11 DIAGNOSIS — M5412 Radiculopathy, cervical region: Secondary | ICD-10-CM

## 2020-11-11 DIAGNOSIS — Z131 Encounter for screening for diabetes mellitus: Secondary | ICD-10-CM

## 2020-11-11 DIAGNOSIS — J3089 Other allergic rhinitis: Secondary | ICD-10-CM

## 2020-11-11 DIAGNOSIS — Z0000 Encounter for general adult medical examination without abnormal findings: Secondary | ICD-10-CM

## 2020-11-11 DIAGNOSIS — F339 Major depressive disorder, recurrent, unspecified: Secondary | ICD-10-CM

## 2020-11-11 DIAGNOSIS — R399 Unspecified symptoms and signs involving the genitourinary system: Secondary | ICD-10-CM

## 2020-11-11 DIAGNOSIS — G43009 Migraine without aura, not intractable, without status migrainosus: Secondary | ICD-10-CM | POA: Diagnosis not present

## 2020-11-11 DIAGNOSIS — G4700 Insomnia, unspecified: Secondary | ICD-10-CM | POA: Diagnosis not present

## 2020-11-11 DIAGNOSIS — Z8042 Family history of malignant neoplasm of prostate: Secondary | ICD-10-CM

## 2020-11-11 DIAGNOSIS — Z9889 Other specified postprocedural states: Secondary | ICD-10-CM

## 2020-11-11 DIAGNOSIS — Z79899 Other long term (current) drug therapy: Secondary | ICD-10-CM

## 2020-11-11 DIAGNOSIS — J302 Other seasonal allergic rhinitis: Secondary | ICD-10-CM

## 2020-11-11 DIAGNOSIS — H1013 Acute atopic conjunctivitis, bilateral: Secondary | ICD-10-CM

## 2020-11-11 DIAGNOSIS — Z13220 Encounter for screening for lipoid disorders: Secondary | ICD-10-CM

## 2020-11-11 DIAGNOSIS — Z23 Encounter for immunization: Secondary | ICD-10-CM | POA: Diagnosis not present

## 2020-11-11 DIAGNOSIS — G479 Sleep disorder, unspecified: Secondary | ICD-10-CM

## 2020-11-11 MED ORDER — SUMATRIPTAN SUCCINATE 100 MG PO TABS: 100.0000 mg | ORAL_TABLET | ORAL | 1 refills | Status: DC | PRN

## 2020-11-11 MED ORDER — MONTELUKAST SODIUM 10 MG PO TABS: 10.0000 mg | ORAL_TABLET | Freq: Every day | ORAL | 1 refills | Status: DC

## 2020-11-11 MED ORDER — UBRELVY 100 MG PO TABS
1.0000 | ORAL_TABLET | Freq: Every day | ORAL | 2 refills | Status: DC | PRN
Start: 2020-11-11 — End: 2021-05-17

## 2020-11-11 MED ORDER — BACLOFEN 10 MG PO TABS: 10.0000 mg | ORAL_TABLET | Freq: Three times a day (TID) | ORAL | 0 refills | Status: DC

## 2020-11-11 MED ORDER — TRAZODONE HCL 100 MG PO TABS: 100.0000 mg | ORAL_TABLET | Freq: Every evening | ORAL | 1 refills | Status: DC

## 2020-11-11 MED ORDER — BUPROPION HCL ER (XL) 150 MG PO TB24
150.0000 mg | ORAL_TABLET | Freq: Every day | ORAL | 1 refills | Status: DC
Start: 2020-11-11 — End: 2021-05-17

## 2020-11-11 MED ORDER — OLOPATADINE HCL 0.1 % OP SOLN: 1.0000 [drp] | Freq: Two times a day (BID) | OPHTHALMIC | 2 refills | Status: DC

## 2020-11-11 MED ORDER — CETIRIZINE HCL 10 MG PO TABS: 10.0000 mg | ORAL_TABLET | Freq: Every day | ORAL | 1 refills | Status: DC

## 2020-11-11 MED ORDER — CITALOPRAM HYDROBROMIDE 40 MG PO TABS: 40.0000 mg | ORAL_TABLET | Freq: Every day | ORAL | 1 refills | Status: DC

## 2020-11-12 LAB — CBC WITH DIFFERENTIAL/PLATELET
Absolute Monocytes: 464 cells/uL (ref 200–950)
Basophils Absolute: 18 cells/uL (ref 0–200)
Basophils Relative: 0.4 %
Eosinophils Absolute: 99 cells/uL (ref 15–500)
Eosinophils Relative: 2.2 %
HCT: 48.8 % (ref 38.5–50.0)
Hemoglobin: 16.4 g/dL (ref 13.2–17.1)
Lymphs Abs: 1355 cells/uL (ref 850–3900)
MCH: 31 pg (ref 27.0–33.0)
MCHC: 33.6 g/dL (ref 32.0–36.0)
MCV: 92.2 fL (ref 80.0–100.0)
MPV: 9.2 fL (ref 7.5–12.5)
Monocytes Relative: 10.3 %
Neutro Abs: 2565 cells/uL (ref 1500–7800)
Neutrophils Relative %: 57 %
Platelets: 229 10*3/uL (ref 140–400)
RBC: 5.29 10*6/uL (ref 4.20–5.80)
RDW: 13.1 % (ref 11.0–15.0)
Total Lymphocyte: 30.1 %
WBC: 4.5 10*3/uL (ref 3.8–10.8)

## 2020-11-12 LAB — COMPLETE METABOLIC PANEL WITH GFR
AG Ratio: 2.1 (calc) (ref 1.0–2.5)
ALT: 20 U/L (ref 9–46)
AST: 21 U/L (ref 10–40)
Albumin: 4.6 g/dL (ref 3.6–5.1)
Alkaline phosphatase (APISO): 59 U/L (ref 36–130)
BUN: 17 mg/dL (ref 7–25)
CO2: 26 mmol/L (ref 20–32)
Calcium: 9.3 mg/dL (ref 8.6–10.3)
Chloride: 107 mmol/L (ref 98–110)
Creat: 1.07 mg/dL (ref 0.60–1.35)
GFR, Est African American: 97 mL/min/{1.73_m2} (ref >=60)
GFR, Est Non African American: 84 mL/min/{1.73_m2} (ref >=60)
Globulin: 2.2 g/dL (calc) (ref 1.9–3.7)
Glucose, Bld: 85 mg/dL (ref 65–99)
Potassium: 4.9 mmol/L (ref 3.5–5.3)
Sodium: 143 mmol/L (ref 135–146)
Total Bilirubin: 0.5 mg/dL (ref 0.2–1.2)
Total Protein: 6.8 g/dL (ref 6.1–8.1)

## 2020-11-12 LAB — LIPID PANEL
Cholesterol: 167 mg/dL (ref <200)
HDL: 72 mg/dL (ref >=40)
LDL Cholesterol (Calc): 80 mg/dL (calc)
Non-HDL Cholesterol (Calc): 95 mg/dL (calc) (ref <130)
Total CHOL/HDL Ratio: 2.3 (calc) (ref <5.0)
Triglycerides: 69 mg/dL (ref <150)

## 2020-11-12 LAB — PSA: PSA: 0.37 ng/mL (ref <=4.0)

## 2020-11-12 LAB — HEMOGLOBIN A1C
Hgb A1c MFr Bld: 5.2 % of total Hgb (ref <5.7)
Mean Plasma Glucose: 103 mg/dL
eAG (mmol/L): 5.7 mmol/L

## 2020-11-20 ENCOUNTER — Telehealth: Payer: Self-pay

## 2020-11-20 NOTE — Telephone Encounter (Signed)
Copied from [place] [id number]. Topic: General - Call Back - No Documentation >> Nov 19, 2020  4:37 PM Erick Blinks wrote: Reason for CRM: Lonn Georgia calling from [employer] is requesting to speak to the office regarding prior authorizations for the Rx olopatadine (PATANOL) 0.1 % ophthalmic solution  Best contact: She disconnected before providing call back number

## 2020-12-06 ENCOUNTER — Other Ambulatory Visit: Payer: Self-pay | Admitting: Family Medicine

## 2020-12-06 DIAGNOSIS — Z9889 Other specified postprocedural states: Secondary | ICD-10-CM

## 2020-12-06 DIAGNOSIS — R0789 Other chest pain: Secondary | ICD-10-CM

## 2020-12-06 NOTE — Telephone Encounter (Signed)
Requested medication (s) are due for refill today: yes  Requested medication (s) are on the active medication list: yes  Last refill:  11/11/20  Future visit scheduled: yes  Notes to clinic:  med not delegated to NT to RF   Requested Prescriptions  Pending Prescriptions Disp Refills   baclofen (LIORESAL) 10 MG tablet [Pharmacy Med Name: BACLOFEN 10 MG TABLET] 90 tablet     Sig: TAKE 1 TABLET BY MOUTH THREE TIMES A DAY      Not Delegated - Analgesics:  Muscle Relaxants Failed - 12/06/2020 10:19 AM      Failed - This refill cannot be delegated      Passed - Valid encounter within last 6 months    Recent Outpatient Visits           3 weeks ago History of back surgery   [facility] Steele Sizer, MD   9 months ago Migraine without aura and without status migrainosus, not intractable   [facility] Steele Sizer, MD   1 year ago Well adult exam   [facility] Steele Sizer, MD   1 year ago Major depression in remission [facility])   [facility] Steele Sizer, MD   1 year ago Allergic conjunctivitis of both eyes   [facility] Steele Sizer, MD       Future Appointments             In 5 months Ancil Boozer, Drue Stager, MD [facility], [place]   In 11 months Steele Sizer, MD [facility], [facility]

## 2021-03-14 ENCOUNTER — Other Ambulatory Visit: Payer: Self-pay | Admitting: Family Medicine

## 2021-03-14 DIAGNOSIS — G43009 Migraine without aura, not intractable, without status migrainosus: Secondary | ICD-10-CM

## 2021-03-14 NOTE — Telephone Encounter (Signed)
Requested Prescriptions  Pending Prescriptions Disp Refills  . SUMAtriptan (IMITREX) 100 MG tablet [Pharmacy Med Name: SUMATRIPTAN SUCC 100 MG TABLET] 9 tablet 1    Sig: TAKE 1 TABLET (100 MG TOTAL) BY MOUTH EVERY 2 (TWO) HOURS AS NEEDED FOR MIGRAINE. MAY REPEAT IN 2 HOURS IF HEADACHE PERSISTS OR RECURS.     Neurology:  Migraine Therapy - Triptan Passed - 03/14/2021  9:47 AM      Passed - Last BP in normal range    BP Readings from Last 1 Encounters:  11/11/20 124/80         Passed - Valid encounter within last 12 months    Recent Outpatient Visits          4 months ago History of back surgery   [facility], Drue Stager, MD   1 year ago Migraine without aura and without status migrainosus, not intractable   [facility] Steele Sizer, MD   1 year ago Well adult exam   [facility] Steele Sizer, MD   2 years ago Major depression in remission [facility])   [facility] Steele Sizer, MD   2 years ago Allergic conjunctivitis of both eyes   [facility] Steele Sizer, MD      Future Appointments            In 2 months Ancil Boozer, Drue Stager, MD [facility], [place]   In 8 months Steele Sizer, MD [facility], [facility]

## 2021-05-09 ENCOUNTER — Other Ambulatory Visit: Payer: Self-pay | Admitting: Family Medicine

## 2021-05-09 DIAGNOSIS — F339 Major depressive disorder, recurrent, unspecified: Secondary | ICD-10-CM

## 2021-05-09 DIAGNOSIS — J302 Other seasonal allergic rhinitis: Secondary | ICD-10-CM

## 2021-05-09 NOTE — Telephone Encounter (Signed)
Requested Prescriptions  Pending Prescriptions Disp Refills  . montelukast (SINGULAIR) 10 MG tablet [Pharmacy Med Name: MONTELUKAST SOD 10 MG TABLET] 90 tablet 0    Sig: TAKE 1 TABLET BY MOUTH EVERYDAY AT BEDTIME     Pulmonology:  Leukotriene Inhibitors Passed - 05/09/2021  3:03 PM      Passed - Valid encounter within last 12 months    Recent Outpatient Visits          5 months ago History of back surgery   [facility] Steele Sizer, MD   1 year ago Migraine without aura and without status migrainosus, not intractable   [facility] Sizer, MD   1 year ago Well adult exam   [facility]izer, [facility]   2 years ago Major depression in remission [facility])   [facility] Steele Sizer, MD   2 years ago Allergic conjunctivitis of both eyes   [facility] Steele Sizer, MD      Future Appointments            In 1 week Steele Sizer, MD [facility], [place]   In 6 months Steele Sizer, MD [facility], [place]           . citalopram (CELEXA) 40 MG tablet [Pharmacy Med Name: CITALOPRAM HBR 40 MG TABLET] 90 tablet 1    Sig: TAKE 1 [place]     Psychiatry:  Antidepressants - SSRI Passed - 05/09/2021  3:03 PM      Passed - Completed PHQ-2 or PHQ-9 in the last 360 days      Passed - Valid encounter within last 6 months    Recent Outpatient Visits          5 months ago History of back surgery   [facility] [place], Drue Stager, MD   1 year ago Migraine without aura and without status migrainosus, not intractable   [facility] Steele Sizer, MD   1 year ago Well adult exam   [facility] Sizer, MD   2 years ago Major depression in remission [facility])   [facility] Sizer, MD   2 years ago Allergic conjunctivitis of both eyes   [facility] Steele Sizer, MD      Future Appointments            In 1 week Steele Sizer, MD [facility], [place]   In 6 months Steele Sizer, MD [facility], [facility]
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
## 2021-05-17 ENCOUNTER — Other Ambulatory Visit: Payer: Self-pay | Admitting: Family Medicine

## 2021-05-17 ENCOUNTER — Ambulatory Visit: Payer: Commercial Managed Care - PPO | Admitting: Family Medicine

## 2021-05-17 ENCOUNTER — Other Ambulatory Visit: Payer: Self-pay

## 2021-05-17 ENCOUNTER — Encounter: Payer: Self-pay | Admitting: Family Medicine

## 2021-05-17 VITALS — BP 122/74 | HR 82 | Temp 97.6°F | Resp 16 | Ht 70.0 in | Wt 162.0 lb

## 2021-05-17 DIAGNOSIS — Z9889 Other specified postprocedural states: Secondary | ICD-10-CM

## 2021-05-17 DIAGNOSIS — M5416 Radiculopathy, lumbar region: Secondary | ICD-10-CM

## 2021-05-17 DIAGNOSIS — G4700 Insomnia, unspecified: Secondary | ICD-10-CM

## 2021-05-17 DIAGNOSIS — F339 Major depressive disorder, recurrent, unspecified: Secondary | ICD-10-CM | POA: Diagnosis not present

## 2021-05-17 DIAGNOSIS — G43009 Migraine without aura, not intractable, without status migrainosus: Secondary | ICD-10-CM

## 2021-05-17 DIAGNOSIS — J302 Other seasonal allergic rhinitis: Secondary | ICD-10-CM

## 2021-05-17 DIAGNOSIS — J3089 Other allergic rhinitis: Secondary | ICD-10-CM

## 2021-05-17 MED ORDER — TRAZODONE HCL 100 MG PO TABS: 100.0000 mg | ORAL_TABLET | Freq: Every evening | ORAL | 1 refills | Status: DC

## 2021-05-17 MED ORDER — NURTEC 75 MG PO TBDP: 1.0000 | ORAL_TABLET | Freq: Every day | ORAL | 2 refills | Status: DC | PRN

## 2021-05-17 MED ORDER — BUPROPION HCL ER (XL) 150 MG PO TB24: 150.0000 mg | ORAL_TABLET | Freq: Every day | ORAL | 1 refills | Status: DC

## 2021-05-17 MED ORDER — MONTELUKAST SODIUM 10 MG PO TABS: ORAL_TABLET | ORAL | 1 refills | Status: DC

## 2021-05-17 MED ORDER — SUMATRIPTAN SUCCINATE 100 MG PO TABS: 100.0000 mg | ORAL_TABLET | ORAL | 1 refills | Status: DC | PRN

## 2021-05-17 NOTE — Patient Instructions (Signed)
[phone] (Work) Dr. Manuella Ghazi

## 2021-05-18 ENCOUNTER — Other Ambulatory Visit: Payer: Self-pay

## 2021-05-18 ENCOUNTER — Other Ambulatory Visit: Payer: Self-pay | Admitting: Family Medicine

## 2021-05-18 MED ORDER — NURTEC 75 MG PO TBDP: 1.0000 | ORAL_TABLET | Freq: Every day | ORAL | 2 refills | Status: DC | PRN

## 2021-06-04 ENCOUNTER — Encounter: Payer: Self-pay | Admitting: Family Medicine

## 2021-06-10 ENCOUNTER — Other Ambulatory Visit: Payer: Self-pay | Admitting: Student

## 2021-06-10 ENCOUNTER — Other Ambulatory Visit (HOSPITAL_COMMUNITY): Payer: Self-pay | Admitting: Student

## 2021-06-10 DIAGNOSIS — M4316 Spondylolisthesis, lumbar region: Secondary | ICD-10-CM

## 2021-06-19 ENCOUNTER — Ambulatory Visit
Admission: RE | Admit: 2021-06-19 | Discharge: 2021-06-19 | Disposition: A | Discharge status: Home / Self Care | Payer: Commercial Managed Care - PPO | Source: Ambulatory Visit | Attending: Student | Admitting: Student

## 2021-06-19 ENCOUNTER — Other Ambulatory Visit: Payer: Self-pay

## 2021-06-19 DIAGNOSIS — M4316 Spondylolisthesis, lumbar region: Secondary | ICD-10-CM | POA: Diagnosis present

## 2021-07-07 ENCOUNTER — Other Ambulatory Visit: Payer: Self-pay

## 2021-07-09 ENCOUNTER — Encounter: Payer: Self-pay | Admitting: Family Medicine

## 2021-07-16 ENCOUNTER — Other Ambulatory Visit: Payer: Self-pay | Admitting: Neurosurgery

## 2021-09-08 NOTE — Progress Notes (Signed)
Surgical Instructions    Your procedure is scheduled on 09/13/21.  Report to [facility] Main Entrance "A" at 8:00 A.M., then check in with the Admitting office.  Call this number if you have problems the morning of surgery:  [phone]   If you have any questions prior to your surgery date call [phone]: Open Monday-Friday 8am-4pm    Remember:  Do not eat or drink after midnight the night before your surgery     Take these medicines the morning of surgery with A SIP OF WATER: buPROPion (WELLBUTRIN XL)  citalopram (CELEXA)   AS NEEDED: olopatadine (PATANOL) SUMAtriptan (IMITREX)  Rimegepant Sulfate (NURTEC)   As of today, STOP taking any Aspirin (unless otherwise instructed by your surgeon) meloxicam (MOBIC), Aleve, Naproxen, Ibuprofen, Motrin, Advil, Goody's, BC's, all herbal medications, fish oil, and all vitamins.   After your COVID test   You are not required to quarantine however you are required to wear a well-fitting mask when you are out and around people not in your household.  If your mask becomes wet or soiled, replace with a new one.  Wash your hands often with soap and water for 20 seconds or clean your hands with an alcohol-based hand sanitizer that contains at least 60% alcohol.  Do not share personal items.  Notify your provider: if you are in close contact with someone who has COVID  or if you develop a fever of 100.4 or greater, sneezing, cough, sore throat, shortness of breath or body aches.          Do not wear jewelry Do not wear lotions, powders, colognes, or deodorant. Do not shave 48 hours prior to surgery.  Men may shave face and neck. Do not bring valuables to the hospital.              [facility] is not responsible for any belongings or valuables.  Do NOT Smoke (Tobacco/Vaping)  24 hours prior to your procedure  If you use a CPAP at night, you may bring your mask for your overnight stay.   Contacts, glasses, hearing aids, dentures or  partials may not be worn into surgery, please bring cases for these belongings   For patients admitted to the hospital, discharge time will be determined by your treatment team.   Patients discharged the day of surgery will not be allowed to drive home, and someone needs to stay with them for 24 hours.  NO VISITORS WILL BE ALLOWED IN PRE-OP WHERE PATIENTS ARE PREPPED FOR SURGERY.  ONLY 1 SUPPORT PERSON MAY BE PRESENT IN THE WAITING ROOM WHILE YOU ARE IN SURGERY.  IF YOU ARE TO BE ADMITTED, ONCE YOU ARE IN YOUR ROOM YOU WILL BE ALLOWED TWO (2) VISITORS. 1 (ONE) VISITOR MAY STAY OVERNIGHT BUT MUST ARRIVE TO THE ROOM BY 8pm.  Minor children may have two parents present. Special consideration for safety and communication needs will be reviewed on a case by case basis.  Special instructions:    Oral Hygiene is also important to reduce your risk of infection.  Remember - BRUSH YOUR TEETH THE MORNING OF SURGERY WITH YOUR REGULAR TOOTHPASTE   [place]- Preparing For Surgery  Before surgery, you can play an important role. Because skin is not sterile, your skin needs to be as free of germs as possible. You can reduce the number of germs on your skin by washing with CHG (chlorahexidine gluconate) Soap before surgery.  CHG is an antiseptic cleaner which kills germs and bonds  with the skin to continue killing germs even after washing.     Please do not use if you have an allergy to CHG or antibacterial soaps. If your skin becomes reddened/irritated stop using the CHG.  Do not shave (including legs and underarms) for at least 48 hours prior to first CHG shower. It is OK to shave your face.  Please follow these instructions carefully.     Shower the NIGHT BEFORE SURGERY and the MORNING OF SURGERY with CHG Soap.   If you chose to wash your hair, wash your hair first as usual with your normal shampoo. After you shampoo, rinse your hair and body thoroughly to remove the shampoo.  Then [employer] and  genitals (private parts) with your normal soap and rinse thoroughly to remove soap.  After that Use CHG Soap as you would any other liquid soap. You can apply CHG directly to the skin and wash gently with a scrungie or a clean washcloth.   Apply the CHG Soap to your body ONLY FROM THE NECK DOWN.  Do not use on open wounds or open sores. Avoid contact with your eyes, ears, mouth and genitals (private parts). Wash Face and genitals (private parts)  with your normal soap.   Wash thoroughly, paying special attention to the area where your surgery will be performed.  Thoroughly rinse your body with warm water from the neck down.  DO NOT shower/wash with your normal soap after using and rinsing off the CHG Soap.  Pat yourself dry with a CLEAN TOWEL.  Wear CLEAN PAJAMAS to bed the night before surgery  Place CLEAN SHEETS on your bed the night before your surgery  DO NOT SLEEP WITH PETS.   Day of Surgery:  Take a shower with CHG soap. Wear Clean/Comfortable clothing the morning of surgery Do not apply any deodorants/lotions.   Remember to brush your teeth WITH YOUR REGULAR TOOTHPASTE.   Please read over the following fact sheets that you were given.

## 2021-09-09 ENCOUNTER — Encounter (HOSPITAL_COMMUNITY)
Admission: RE | Admit: 2021-09-09 | Discharge: 2021-09-09 | Disposition: A | Discharge status: Home / Self Care | Payer: Commercial Managed Care - PPO | Source: Ambulatory Visit | Attending: Neurosurgery | Admitting: Neurosurgery

## 2021-09-09 ENCOUNTER — Other Ambulatory Visit: Payer: Self-pay

## 2021-09-09 ENCOUNTER — Encounter (HOSPITAL_COMMUNITY): Payer: Self-pay

## 2021-09-09 ENCOUNTER — Other Ambulatory Visit: Payer: Self-pay | Admitting: Neurosurgery

## 2021-09-09 VITALS — BP 120/81 | HR 78 | Temp 98.0°F | Resp 17 | Ht 70.0 in | Wt 152.1 lb

## 2021-09-09 DIAGNOSIS — Z01812 Encounter for preprocedural laboratory examination: Secondary | ICD-10-CM | POA: Diagnosis present

## 2021-09-09 DIAGNOSIS — Z20822 Contact with and (suspected) exposure to covid-19: Secondary | ICD-10-CM | POA: Insufficient documentation

## 2021-09-09 DIAGNOSIS — Z01818 Encounter for other preprocedural examination: Secondary | ICD-10-CM

## 2021-09-09 HISTORY — DX: Gastro-esophageal reflux disease without esophagitis: K21.9

## 2021-09-09 LAB — CBC
HCT: 47.9 % (ref 39.0–52.0)
Hemoglobin: 15.7 g/dL (ref 13.0–17.0)
MCH: 30.8 pg (ref 26.0–34.0)
MCHC: 32.8 g/dL (ref 30.0–36.0)
MCV: 93.9 fL (ref 80.0–100.0)
Platelets: 230 10*3/uL (ref 150–400)
RBC: 5.1 MIL/uL (ref 4.22–5.81)
RDW: 13.6 % (ref 11.5–15.5)
WBC: 5.3 10*3/uL (ref 4.0–10.5)
nRBC: 0 % (ref 0.0–0.2)

## 2021-09-09 LAB — COMPREHENSIVE METABOLIC PANEL
ALT: 21 U/L (ref 0–44)
AST: 19 U/L (ref 15–41)
Albumin: 3.6 g/dL (ref 3.5–5.0)
Alkaline Phosphatase: 59 U/L (ref 38–126)
Anion gap: 7 (ref 5–15)
BUN: 17 mg/dL (ref 6–20)
CO2: 27 mmol/L (ref 22–32)
Calcium: 8.9 mg/dL (ref 8.9–10.3)
Chloride: 108 mmol/L (ref 98–111)
Creatinine, Ser: 0.89 mg/dL (ref 0.61–1.24)
GFR, Estimated: >60 mL/min (ref >60)
Glucose, Bld: 82 mg/dL (ref 70–99)
Potassium: 4.4 mmol/L (ref 3.5–5.1)
Sodium: 142 mmol/L (ref 135–145)
Total Bilirubin: 0.7 mg/dL (ref 0.3–1.2)
Total Protein: 6.2 g/dL — ABNORMAL LOW (ref 6.5–8.1)

## 2021-09-09 LAB — SURGICAL PCR SCREEN
MRSA, PCR: NEGATIVE
Staphylococcus aureus: POSITIVE — AB

## 2021-09-09 LAB — TYPE AND SCREEN
ABO/RH(D): O POS
Antibody Screen: NEGATIVE

## 2021-09-09 LAB — SARS CORONAVIRUS 2 (TAT 6-24 HRS): SARS Coronavirus 2: NEGATIVE

## 2021-09-09 NOTE — Progress Notes (Signed)
PCP: Steele Sizer, MD Cardiologist: denies  EKG: 03/06/17 CXR: na ECHO: denies Stress Test: deies Cardiac Cath: denies  Fasting Blood Sugar- na Checks Blood Sugar__na_ times a day  OSA/CPAP: No  ASA/Blood Thinner: No  Covid test 09/09/21 at PAT  Anesthesia Review: No  Patient denies shortness of breath, fever, cough, and chest pain at PAT appointment.  Patient verbalized understanding of instructions provided today at the PAT appointment.  Patient asked to review instructions at home and day of surgery.

## 2021-09-13 ENCOUNTER — Ambulatory Visit (HOSPITAL_COMMUNITY)
Admission: RE | Admit: 2021-09-13 | Discharge: 2021-09-15 | Disposition: A | Discharge status: Home / Self Care | Payer: Commercial Managed Care - PPO | Attending: Neurosurgery | Admitting: Neurosurgery

## 2021-09-13 ENCOUNTER — Ambulatory Visit (HOSPITAL_COMMUNITY): Payer: Commercial Managed Care - PPO | Admitting: Certified Registered"

## 2021-09-13 ENCOUNTER — Ambulatory Visit (HOSPITAL_COMMUNITY): Payer: Commercial Managed Care - PPO

## 2021-09-13 ENCOUNTER — Ambulatory Visit (HOSPITAL_COMMUNITY)
Admission: RE | Disposition: A | Discharge status: Home / Self Care | Payer: Self-pay | Source: Home / Self Care | Attending: Neurosurgery

## 2021-09-13 ENCOUNTER — Other Ambulatory Visit: Payer: Self-pay

## 2021-09-13 ENCOUNTER — Encounter (HOSPITAL_COMMUNITY): Payer: Self-pay | Admitting: Neurosurgery

## 2021-09-13 DIAGNOSIS — M48062 Spinal stenosis, lumbar region with neurogenic claudication: Secondary | ICD-10-CM | POA: Diagnosis not present

## 2021-09-13 DIAGNOSIS — M1990 Unspecified osteoarthritis, unspecified site: Secondary | ICD-10-CM | POA: Insufficient documentation

## 2021-09-13 DIAGNOSIS — K219 Gastro-esophageal reflux disease without esophagitis: Secondary | ICD-10-CM | POA: Diagnosis not present

## 2021-09-13 DIAGNOSIS — M4726 Other spondylosis with radiculopathy, lumbar region: Secondary | ICD-10-CM | POA: Insufficient documentation

## 2021-09-13 DIAGNOSIS — M4316 Spondylolisthesis, lumbar region: Secondary | ICD-10-CM | POA: Diagnosis not present

## 2021-09-13 DIAGNOSIS — Z419 Encounter for procedure for purposes other than remedying health state, unspecified: Secondary | ICD-10-CM

## 2021-09-13 DIAGNOSIS — R339 Retention of urine, unspecified: Secondary | ICD-10-CM | POA: Insufficient documentation

## 2021-09-13 DIAGNOSIS — M5116 Intervertebral disc disorders with radiculopathy, lumbar region: Secondary | ICD-10-CM | POA: Diagnosis not present

## 2021-09-13 DIAGNOSIS — M961 Postlaminectomy syndrome, not elsewhere classified: Secondary | ICD-10-CM | POA: Diagnosis present

## 2021-09-13 DIAGNOSIS — R519 Headache, unspecified: Secondary | ICD-10-CM | POA: Insufficient documentation

## 2021-09-13 DIAGNOSIS — F32A Depression, unspecified: Secondary | ICD-10-CM | POA: Insufficient documentation

## 2021-09-13 HISTORY — PX: SPINAL FUSION: SHX223

## 2021-09-13 LAB — ABO/RH: ABO/RH(D): O POS

## 2021-09-13 SURGERY — POSTERIOR LUMBAR FUSION 1 LEVEL
Anesthesia: General

## 2021-09-13 MED ORDER — MORPHINE SULFATE (PF) 4 MG/ML IV SOLN
4.0000 mg | INTRAVENOUS | Status: DC | PRN
  Administered 2021-09-13 – 2021-09-14 (×2): 4 mg via INTRAVENOUS
  Filled 2021-09-13 (×2): qty 1

## 2021-09-13 MED ORDER — TAMSULOSIN HCL 0.4 MG PO CAPS
0.4000 mg | ORAL_CAPSULE | Freq: Every day | ORAL | Status: DC
  Administered 2021-09-13 – 2021-09-15 (×3): 0.4 mg via ORAL
  Filled 2021-09-13 (×3): qty 1

## 2021-09-13 MED ORDER — CITALOPRAM HYDROBROMIDE 20 MG PO TABS
40.0000 mg | ORAL_TABLET | Freq: Every day | ORAL | Status: DC
  Administered 2021-09-14 – 2021-09-15 (×2): 40 mg via ORAL
  Filled 2021-09-13 (×2): qty 2

## 2021-09-13 MED ORDER — THROMBIN 5000 UNITS EX SOLR
CUTANEOUS | Status: AC
  Filled 2021-09-13: qty 5000

## 2021-09-13 MED ORDER — OXYCODONE HCL 5 MG PO TABS: 5.0000 mg | ORAL_TABLET | ORAL | Status: DC | PRN

## 2021-09-13 MED ORDER — SODIUM CHLORIDE 0.9% FLUSH: 3.0000 mL | INTRAVENOUS | Status: DC | PRN

## 2021-09-13 MED ORDER — SUGAMMADEX SODIUM 200 MG/2ML IV SOLN
INTRAVENOUS | Status: DC | PRN
Start: 2021-09-13 — End: 2021-09-13
  Administered 2021-09-13: 200 mg via INTRAVENOUS

## 2021-09-13 MED ORDER — BUPIVACAINE-EPINEPHRINE (PF) 0.5% -1:200000 IJ SOLN
INTRAMUSCULAR | Status: DC | PRN
  Administered 2021-09-13: 10 mL via PERINEURAL

## 2021-09-13 MED ORDER — OXYCODONE HCL 5 MG PO TABS
ORAL_TABLET | ORAL | Status: AC
  Filled 2021-09-13: qty 1

## 2021-09-13 MED ORDER — DEXAMETHASONE SODIUM PHOSPHATE 10 MG/ML IJ SOLN
INTRAMUSCULAR | Status: DC | PRN
  Administered 2021-09-13: 10 mg via INTRAVENOUS

## 2021-09-13 MED ORDER — PHENYLEPHRINE HCL-NACL 20-0.9 MG/250ML-% IV SOLN
INTRAVENOUS | Status: DC | PRN
  Administered 2021-09-13: 25 ug/min via INTRAVENOUS

## 2021-09-13 MED ORDER — MIDAZOLAM HCL 2 MG/2ML IJ SOLN
INTRAMUSCULAR | Status: DC | PRN
  Administered 2021-09-13: 2 mg via INTRAVENOUS

## 2021-09-13 MED ORDER — THROMBIN 5000 UNITS EX SOLR
OROMUCOSAL | Status: DC | PRN
  Administered 2021-09-13 (×2): 5 mL via TOPICAL

## 2021-09-13 MED ORDER — OXYCODONE HCL 5 MG PO TABS
5.0000 mg | ORAL_TABLET | Freq: Once | ORAL | Status: AC | PRN
  Administered 2021-09-13: 5 mg via ORAL

## 2021-09-13 MED ORDER — ONDANSETRON HCL 4 MG/2ML IJ SOLN: 4.0000 mg | Freq: Four times a day (QID) | INTRAMUSCULAR | Status: DC | PRN

## 2021-09-13 MED ORDER — FENTANYL CITRATE (PF) 250 MCG/5ML IJ SOLN
INTRAMUSCULAR | Status: AC
  Filled 2021-09-13: qty 5

## 2021-09-13 MED ORDER — BISACODYL 10 MG RE SUPP: 10.0000 mg | Freq: Every day | RECTAL | Status: DC | PRN

## 2021-09-13 MED ORDER — 0.9 % SODIUM CHLORIDE (POUR BTL) OPTIME
TOPICAL | Status: DC | PRN
  Administered 2021-09-13: 1000 mL

## 2021-09-13 MED ORDER — ACETAMINOPHEN 500 MG PO TABS
1000.0000 mg | ORAL_TABLET | Freq: Four times a day (QID) | ORAL | Status: AC
  Administered 2021-09-13 – 2021-09-14 (×4): 1000 mg via ORAL
  Filled 2021-09-13 (×4): qty 2

## 2021-09-13 MED ORDER — FENTANYL CITRATE (PF) 100 MCG/2ML IJ SOLN
INTRAMUSCULAR | Status: AC
  Filled 2021-09-13: qty 2

## 2021-09-13 MED ORDER — HYDROXYZINE HCL 50 MG/ML IM SOLN: 50.0000 mg | Freq: Four times a day (QID) | INTRAMUSCULAR | Status: DC | PRN

## 2021-09-13 MED ORDER — FENTANYL CITRATE (PF) 100 MCG/2ML IJ SOLN
25.0000 ug | INTRAMUSCULAR | Status: DC | PRN
  Administered 2021-09-13 (×3): 50 ug via INTRAVENOUS

## 2021-09-13 MED ORDER — ROCURONIUM BROMIDE 10 MG/ML (PF) SYRINGE
PREFILLED_SYRINGE | INTRAVENOUS | Status: AC
  Filled 2021-09-13: qty 10

## 2021-09-13 MED ORDER — SUMATRIPTAN SUCCINATE 100 MG PO TABS
100.0000 mg | ORAL_TABLET | ORAL | Status: DC | PRN
  Filled 2021-09-13 (×4): qty 1

## 2021-09-13 MED ORDER — DEXAMETHASONE SODIUM PHOSPHATE 10 MG/ML IJ SOLN
INTRAMUSCULAR | Status: AC
  Filled 2021-09-13: qty 1

## 2021-09-13 MED ORDER — MONTELUKAST SODIUM 10 MG PO TABS: 10.0000 mg | ORAL_TABLET | Freq: Every evening | ORAL | Status: DC | PRN

## 2021-09-13 MED ORDER — CHLORHEXIDINE GLUCONATE CLOTH 2 % EX PADS: 6.0000 | MEDICATED_PAD | Freq: Once | CUTANEOUS | Status: DC

## 2021-09-13 MED ORDER — BUPIVACAINE LIPOSOME 1.3 % IJ SUSP
INTRAMUSCULAR | Status: DC | PRN
  Administered 2021-09-13: 20 mL

## 2021-09-13 MED ORDER — PROPOFOL 10 MG/ML IV BOLUS
INTRAVENOUS | Status: DC | PRN
  Administered 2021-09-13: 200 mg via INTRAVENOUS

## 2021-09-13 MED ORDER — CEFAZOLIN SODIUM-DEXTROSE 2-4 GM/100ML-% IV SOLN
2.0000 g | INTRAVENOUS | Status: AC
  Administered 2021-09-13: 2 g via INTRAVENOUS
  Filled 2021-09-13: qty 100

## 2021-09-13 MED ORDER — MIDAZOLAM HCL 2 MG/2ML IJ SOLN
INTRAMUSCULAR | Status: AC
  Filled 2021-09-13: qty 2

## 2021-09-13 MED ORDER — OXYCODONE HCL 5 MG PO TABS
10.0000 mg | ORAL_TABLET | ORAL | Status: DC | PRN
  Administered 2021-09-13 – 2021-09-15 (×10): 10 mg via ORAL
  Filled 2021-09-13 (×10): qty 2

## 2021-09-13 MED ORDER — ACETAMINOPHEN 650 MG RE SUPP: 650.0000 mg | RECTAL | Status: DC | PRN

## 2021-09-13 MED ORDER — BUPIVACAINE LIPOSOME 1.3 % IJ SUSP
INTRAMUSCULAR | Status: AC
  Filled 2021-09-13: qty 20

## 2021-09-13 MED ORDER — ORAL CARE MOUTH RINSE: 15.0000 mL | Freq: Once | OROMUCOSAL | Status: AC

## 2021-09-13 MED ORDER — EPHEDRINE SULFATE-NACL 50-0.9 MG/10ML-% IV SOSY
PREFILLED_SYRINGE | INTRAVENOUS | Status: DC | PRN
Start: 2021-09-13 — End: 2021-09-13
  Administered 2021-09-13: 5 mg via INTRAVENOUS
  Administered 2021-09-13: 10 mg via INTRAVENOUS

## 2021-09-13 MED ORDER — BUPROPION HCL ER (XL) 150 MG PO TB24
150.0000 mg | ORAL_TABLET | Freq: Every day | ORAL | Status: DC
  Administered 2021-09-14 – 2021-09-15 (×2): 150 mg via ORAL
  Filled 2021-09-13 (×2): qty 1

## 2021-09-13 MED ORDER — CYCLOBENZAPRINE HCL 10 MG PO TABS
10.0000 mg | ORAL_TABLET | Freq: Three times a day (TID) | ORAL | Status: DC | PRN
  Administered 2021-09-13 – 2021-09-15 (×3): 10 mg via ORAL
  Filled 2021-09-13 (×3): qty 1

## 2021-09-13 MED ORDER — SODIUM CHLORIDE 0.9% FLUSH
3.0000 mL | Freq: Two times a day (BID) | INTRAVENOUS | Status: DC
  Administered 2021-09-14: 3 mL via INTRAVENOUS

## 2021-09-13 MED ORDER — ONDANSETRON HCL 4 MG/2ML IJ SOLN
INTRAMUSCULAR | Status: AC
  Filled 2021-09-13: qty 2

## 2021-09-13 MED ORDER — TRAZODONE HCL 50 MG PO TABS
100.0000 mg | ORAL_TABLET | Freq: Every evening | ORAL | Status: DC
  Administered 2021-09-13 – 2021-09-14 (×2): 100 mg via ORAL
  Filled 2021-09-13 (×2): qty 2

## 2021-09-13 MED ORDER — BACITRACIN ZINC 500 UNIT/GM EX OINT
TOPICAL_OINTMENT | CUTANEOUS | Status: AC
  Filled 2021-09-13: qty 28.35

## 2021-09-13 MED ORDER — DOCUSATE SODIUM 100 MG PO CAPS
100.0000 mg | ORAL_CAPSULE | Freq: Two times a day (BID) | ORAL | Status: DC
  Administered 2021-09-13 – 2021-09-15 (×4): 100 mg via ORAL
  Filled 2021-09-13 (×3): qty 1

## 2021-09-13 MED ORDER — CEFAZOLIN SODIUM-DEXTROSE 2-4 GM/100ML-% IV SOLN
INTRAVENOUS | Status: AC
  Filled 2021-09-13: qty 100

## 2021-09-13 MED ORDER — LIDOCAINE 2% (20 MG/ML) 5 ML SYRINGE
INTRAMUSCULAR | Status: DC | PRN
  Administered 2021-09-13: 60 mg via INTRAVENOUS

## 2021-09-13 MED ORDER — LACTATED RINGERS IV SOLN: INTRAVENOUS | Status: DC

## 2021-09-13 MED ORDER — FENTANYL CITRATE (PF) 250 MCG/5ML IJ SOLN
INTRAMUSCULAR | Status: DC | PRN
  Administered 2021-09-13: 50 ug via INTRAVENOUS
  Administered 2021-09-13: 100 ug via INTRAVENOUS
  Administered 2021-09-13: 50 ug via INTRAVENOUS

## 2021-09-13 MED ORDER — BUPIVACAINE-EPINEPHRINE 0.5% -1:200000 IJ SOLN
INTRAMUSCULAR | Status: AC
  Filled 2021-09-13: qty 1

## 2021-09-13 MED ORDER — OXYCODONE HCL 5 MG/5ML PO SOLN: 5.0000 mg | Freq: Once | ORAL | Status: AC | PRN

## 2021-09-13 MED ORDER — BACITRACIN ZINC 500 UNIT/GM EX OINT
TOPICAL_OINTMENT | CUTANEOUS | Status: DC | PRN
  Administered 2021-09-13: 1 via TOPICAL

## 2021-09-13 MED ORDER — LIDOCAINE 2% (20 MG/ML) 5 ML SYRINGE
INTRAMUSCULAR | Status: AC
  Filled 2021-09-13: qty 5

## 2021-09-13 MED ORDER — PHENOL 1.4 % MT LIQD: 1.0000 | OROMUCOSAL | Status: DC | PRN

## 2021-09-13 MED ORDER — PROPOFOL 10 MG/ML IV BOLUS
INTRAVENOUS | Status: AC
  Filled 2021-09-13: qty 20

## 2021-09-13 MED ORDER — OLOPATADINE HCL 0.1 % OP SOLN
1.0000 [drp] | Freq: Two times a day (BID) | OPHTHALMIC | Status: DC | PRN
  Filled 2021-09-13: qty 5

## 2021-09-13 MED ORDER — ROCURONIUM BROMIDE 10 MG/ML (PF) SYRINGE
PREFILLED_SYRINGE | INTRAVENOUS | Status: DC | PRN
Start: 2021-09-13 — End: 2021-09-13
  Administered 2021-09-13: 70 mg via INTRAVENOUS
  Administered 2021-09-13: 30 mg via INTRAVENOUS
  Administered 2021-09-13 (×3): 20 mg via INTRAVENOUS

## 2021-09-13 MED ORDER — MENTHOL 3 MG MT LOZG: 1.0000 | LOZENGE | OROMUCOSAL | Status: DC | PRN

## 2021-09-13 MED ORDER — ACETAMINOPHEN 325 MG PO TABS
650.0000 mg | ORAL_TABLET | ORAL | Status: DC | PRN
  Administered 2021-09-14 – 2021-09-15 (×2): 650 mg via ORAL
  Filled 2021-09-13 (×3): qty 2

## 2021-09-13 MED ORDER — ACETAMINOPHEN 10 MG/ML IV SOLN
INTRAVENOUS | Status: DC | PRN
  Administered 2021-09-13: 1000 mg via INTRAVENOUS

## 2021-09-13 MED ORDER — CEFAZOLIN SODIUM-DEXTROSE 2-4 GM/100ML-% IV SOLN
2.0000 g | Freq: Three times a day (TID) | INTRAVENOUS | Status: AC
  Administered 2021-09-13 – 2021-09-14 (×2): 2 g via INTRAVENOUS
  Filled 2021-09-13 (×2): qty 100

## 2021-09-13 MED ORDER — SODIUM CHLORIDE 0.9 % IV SOLN
250.0000 mL | INTRAVENOUS | Status: DC
  Administered 2021-09-13: 250 mL via INTRAVENOUS

## 2021-09-13 MED ORDER — ACETAMINOPHEN 10 MG/ML IV SOLN
INTRAVENOUS | Status: AC
  Filled 2021-09-13: qty 100

## 2021-09-13 MED ORDER — ONDANSETRON HCL 4 MG PO TABS: 4.0000 mg | ORAL_TABLET | Freq: Four times a day (QID) | ORAL | Status: DC | PRN

## 2021-09-13 MED ORDER — CHLORHEXIDINE GLUCONATE 0.12 % MT SOLN
15.0000 mL | Freq: Once | OROMUCOSAL | Status: AC
  Administered 2021-09-13: 15 mL via OROMUCOSAL
  Filled 2021-09-13: qty 15

## 2021-09-13 MED ORDER — PHENYLEPHRINE HCL (PRESSORS) 10 MG/ML IV SOLN
INTRAVENOUS | Status: DC | PRN
  Administered 2021-09-13: 120 ug via INTRAVENOUS
  Administered 2021-09-13: 80 ug via INTRAVENOUS
  Administered 2021-09-13: 120 ug via INTRAVENOUS

## 2021-09-13 SURGICAL SUPPLY — 67 items
BAG COUNTER SPONGE SURGICOUNT (BAG) ×2 IMPLANT
BASKET BONE COLLECTION (BASKET) ×2 IMPLANT
BENZOIN TINCTURE PRP APPL 2/3 (GAUZE/BANDAGES/DRESSINGS) ×2 IMPLANT
BLADE CLIPPER SURG (BLADE) IMPLANT
BUR MATCHSTICK NEURO 3.0 LAGG (BURR) ×2 IMPLANT
BUR PRECISION FLUTE 6.0 (BURR) ×2 IMPLANT
CANISTER SUCT 3000ML PPV (MISCELLANEOUS) ×2 IMPLANT
CAP LOCK DLX THRD (Cap) ×4 IMPLANT
CARTRIDGE OIL MAESTRO DRILL (MISCELLANEOUS) ×1 IMPLANT
CNTNR URN SCR LID CUP LEK RST (MISCELLANEOUS) ×1 IMPLANT
CONT SPEC 4OZ STRL OR WHT (MISCELLANEOUS) ×1
COVER BACK TABLE 60X90IN (DRAPES) ×2 IMPLANT
DECANTER SPIKE VIAL GLASS SM (MISCELLANEOUS) ×2 IMPLANT
DIFFUSER DRILL AIR PNEUMATIC (MISCELLANEOUS) ×2 IMPLANT
DRAPE C-ARM 42X72 X-RAY (DRAPES) ×4 IMPLANT
DRAPE HALF SHEET 40X57 (DRAPES) ×2 IMPLANT
DRAPE LAPAROTOMY 100X72X124 (DRAPES) ×2 IMPLANT
DRAPE SURG 17X23 STRL (DRAPES) ×4 IMPLANT
DRSG OPSITE POSTOP 4X6 (GAUZE/BANDAGES/DRESSINGS) ×2 IMPLANT
ELECT BLADE 4.0 EZ CLEAN MEGAD (MISCELLANEOUS) ×2
ELECT REM PT RETURN 9FT ADLT (ELECTROSURGICAL) ×2
ELECTRODE BLDE 4.0 EZ CLN MEGD (MISCELLANEOUS) ×1 IMPLANT
ELECTRODE REM PT RTRN 9FT ADLT (ELECTROSURGICAL) ×1 IMPLANT
EVACUATOR 1/8 PVC DRAIN (DRAIN) IMPLANT
GAUZE 4X4 16PLY ~~LOC~~+RFID DBL (SPONGE) ×2 IMPLANT
GLOVE EXAM NITRILE XL STR (GLOVE) IMPLANT
GLOVE SURG ENC MOIS LTX SZ8 (GLOVE) ×4 IMPLANT
GLOVE SURG ENC MOIS LTX SZ8.5 (GLOVE) ×4 IMPLANT
GLOVE SURG POLYISO LF SZ7 (GLOVE) ×2 IMPLANT
GLOVE SURG UNDER POLY LF SZ7.5 (GLOVE) ×3 IMPLANT
GOWN STRL REUS W/ TWL LRG LVL3 (GOWN DISPOSABLE) IMPLANT
GOWN STRL REUS W/ TWL XL LVL3 (GOWN DISPOSABLE) ×2 IMPLANT
GOWN STRL REUS W/TWL 2XL LVL3 (GOWN DISPOSABLE) IMPLANT
GOWN STRL REUS W/TWL LRG LVL3 (GOWN DISPOSABLE) ×2
GOWN STRL REUS W/TWL XL LVL3 (GOWN DISPOSABLE) ×3
HEMOSTAT POWDER KIT SURGIFOAM (HEMOSTASIS) ×3 IMPLANT
KIT BASIN OR (CUSTOM PROCEDURE TRAY) ×2 IMPLANT
KIT GRAFTMAG DEL NEURO DISP (NEUROSURGERY SUPPLIES) ×1 IMPLANT
KIT TURNOVER KIT B (KITS) ×2 IMPLANT
MILL MEDIUM DISP (BLADE) ×1 IMPLANT
NDL HYPO 21X1.5 SAFETY (NEEDLE) IMPLANT
NEEDLE HYPO 21X1.5 SAFETY (NEEDLE) ×2 IMPLANT
NEEDLE HYPO 22GX1.5 SAFETY (NEEDLE) ×2 IMPLANT
NS IRRIG 1000ML POUR BTL (IV SOLUTION) ×2 IMPLANT
OIL CARTRIDGE MAESTRO DRILL (MISCELLANEOUS) ×2
PACK LAMINECTOMY NEURO (CUSTOM PROCEDURE TRAY) ×2 IMPLANT
PAD ARMBOARD 7.5X6 YLW CONV (MISCELLANEOUS) ×5 IMPLANT
PATTIES SURGICAL .5 X1 (DISPOSABLE) IMPLANT
PATTIES SURGICAL 1X1 (DISPOSABLE) ×1 IMPLANT
PUTTY DBM 10CC CALC GRAN (Putty) ×1 IMPLANT
ROD CREO DLX CVD 6.35X40 (Rod) IMPLANT
ROD CURVED TI 6.35X40 (Rod) ×2 IMPLANT
SCREW PA DLX CREO 7.5X50 (Screw) ×4 IMPLANT
SPACER ALTERA 10X26 10-14MM 15 (Spacer) ×1 IMPLANT
SPONGE NEURO XRAY DETECT 1X3 (DISPOSABLE) IMPLANT
SPONGE SURGIFOAM ABS GEL 100 (HEMOSTASIS) IMPLANT
SPONGE T-LAP 4X18 ~~LOC~~+RFID (SPONGE) ×2 IMPLANT
STRIP CLOSURE SKIN 1/2X4 (GAUZE/BANDAGES/DRESSINGS) ×2 IMPLANT
SUT VIC AB 1 CT1 18XBRD ANBCTR (SUTURE) ×2 IMPLANT
SUT VIC AB 1 CT1 8-18 (SUTURE) ×1
SUT VIC AB 2-0 CP2 18 (SUTURE) ×3 IMPLANT
SYR 20ML LL LF (SYRINGE) ×1 IMPLANT
TOWEL GREEN STERILE (TOWEL DISPOSABLE) ×2 IMPLANT
TOWEL GREEN STERILE FF (TOWEL DISPOSABLE) ×2 IMPLANT
TRAY FOLEY MTR SLVR 14FR STAT (SET/KITS/TRAYS/PACK) ×1 IMPLANT
TRAY FOLEY MTR SLVR 16FR STAT (SET/KITS/TRAYS/PACK) ×1 IMPLANT
WATER STERILE IRR 1000ML POUR (IV SOLUTION) ×2 IMPLANT

## 2021-09-13 NOTE — Op Note (Signed)
Brief history: The patient is a 46 year old white male on whom another physician for bilateral L4-5 laminotomy foraminotomies years ago.  The patient has developed recurrent back and leg pain consistent with neurogenic claudication/lumbar radiculopathy.  He failed medical management.  He is worked up with lumbar x-rays and lumbar MRI which demonstrated L4-5 degenerative disease, spondylolisthesis, spinal stenosis, facet arthropathy, etc.  I discussed the various treatment options with him.  He has decided proceed with surgery.  Preoperative diagnosis: L4-5 spondylolisthesis, degenerative disc disease, spinal stenosis compressing both the L4 and the L5 nerve roots; lumbago; lumbar radiculopathy; neurogenic claudication  Postoperative diagnosis: Same  Procedure: Bilateral redo L4-5 laminotomy/foraminotomies/medial facetectomy to decompress the bilateral L4 and L5 nerve roots(the work required to do this was in addition to the work required to do the posterior lumbar interbody fusion because of the patient's spinal stenosis, facet arthropathy. Etc. requiring a wide decompression of the nerve roots.);  L4-5 transforaminal lumbar interbody fusion with local morselized autograft bone and Zimmer DBM; insertion of interbody prosthesis at L4-5 (globus peek expandable interbody prosthesis); posterior nonsegmental instrumentation from L4 to L5 with globus titanium pedicle screws and rods; posterior lateral arthrodesis at L4-5 with local morselized autograft bone and Zimmer DBM.  Surgeon: Dr. Earle Gell  Asst.: Arnetha Massy, NP  Anesthesia: Gen. endotracheal  Estimated blood loss: 150 cc  Drains: None  Complications: None  Description of procedure: The patient was brought to the operating room by the anesthesia team. General endotracheal anesthesia was induced. The patient was turned to the prone position on the Wilson frame. The patient's lumbosacral region was then prepared with Betadine scrub and  Betadine solution. Sterile drapes were applied.  I then injected the area to be incised with Marcaine with epinephrine solution. I then used the scalpel to make a linear midline incision over the L4-5 interspace, incising through the old surgical scar.  I then used electrocautery to perform a bilateral subperiosteal dissection exposing the spinous process and lamina of L4 and L5. We then obtained intraoperative radiograph to confirm our location. We then inserted the Verstrac retractor to provide exposure.  I began the decompression by using the high speed drill to perform redo laminotomies at L4-5 bilaterally. We then used the Kerrison punches to widen the laminotomy and removed the ligamentum flavum as well as the epidural scar tissue at L4-5 bilaterally. We used the Kerrison punches to remove the medial facets at L4-5 bilaterally. We performed wide foraminotomies about the bilateral L4-5 bilaterally nerve roots completing the decompression.  We now turned our attention to the posterior lumbar interbody fusion. I used a scalpel to incise the intervertebral disc at L4-5 bilaterally. I then performed a partial intervertebral discectomy at L4-5 bilaterally using the pituitary forceps. We prepared the vertebral endplates at S9-7 bilaterally for the fusion by removing the soft tissues with the curettes. We then used the trial spacers to pick the appropriate sized interbody prosthesis. We prefilled his prosthesis with a combination of local morselized autograft bone that we obtained during the decompression as well as Zimmer DBM. We inserted the prefilled prosthesis into the interspace at L4-5, we then turned and expanded the prosthesis. There was a good snug fit of the prosthesis in the interspace. We then filled and the remainder of the intervertebral disc space with local morselized autograft bone and Zimmer DBM. This completed the posterior lumbar interbody arthrodesis.  During the decompression and insertion  of the prosthesis the assistant protected the thecal sac and nerve roots with the  D'Errico retractor.  We now turned attention to the instrumentation. Under fluoroscopic guidance we cannulated the bilateral L4 and L5 pedicles with the bone probe. We then removed the bone probe. We then tapped the pedicle with a 6.5 millimeter tap. We then removed the tap. We probed inside the tapped pedicle with a ball probe to rule out cortical breaches. We then inserted a 7.5 x 50 millimeter pedicle screw into the L4 and L5 pedicles bilaterally under fluoroscopic guidance. We then palpated along the medial aspect of the pedicles to rule out cortical breaches. There were none. The nerve roots were not injured. We then connected the unilateral pedicle screws with a lordotic rod. We compressed the construct and secured the rod in place with the caps. We then tightened the caps appropriately. This completed the instrumentation from L4-5 bilaterally.  We now turned our attention to the posterior lateral arthrodesis at L4-5. We used the high-speed drill to decorticate the remainder of the facets, pars, transverse process at L4-5. We then applied a combination of local morselized autograft bone and Zimmer DBM over these decorticated posterior lateral structures. This completed the posterior lateral arthrodesis.  We then obtained hemostasis using bipolar electrocautery. We irrigated the wound out with saline solution. We inspected the thecal sac and nerve roots and noted they were well decompressed. We then removed the retractor.  We injected Exparel . We reapproximated patient's thoracolumbar fascia with interrupted #1 Vicryl suture. We reapproximated patient's subcutaneous tissue with interrupted 2-0 Vicryl suture. The reapproximated patient's skin with Steri-Strips and benzoin. The wound was then coated with bacitracin ointment. A sterile dressing was applied. The drapes were removed. The patient was subsequently returned to the  supine position where they were extubated by the anesthesia team. He was then transported to the post anesthesia care unit in stable condition. All sponge instrument and needle counts were reportedly correct at the end of this case.

## 2021-09-13 NOTE — H&P (Signed)
Subjective: The patient is a 46 year old white male on whom Dr. Hal Neer performed bilateral L4-5 laminotomies in 2015.  He has developed recurrent back and left greater than right leg pain consistent with neurogenic claudication.  He has failed medical management and was worked up with a lumbar MRI and lumbar x-rays which demonstrated the patient had an L4-5 spondylolisthesis and spinal stenosis.  I discussed the various treatment options with him.  He has decided to proceed with surgery.  Past Medical History:  Diagnosis Date   Allergy    Depression    DJD (degenerative joint disease)    GERD (gastroesophageal reflux disease)    Insomnia    Premature ejaculation    Rosacea     Past Surgical History:  Procedure Laterality Date   LAMINECTOMY  08/14/2014   NASAL SEPTOPLASTY W/ TURBINOPLASTY  09/05/2008   ORIF METATARSAL FRACTURE Right     No Known Allergies  Social History   Tobacco Use   Smoking status: Never   Smokeless tobacco: Never  Substance Use Topics   Alcohol use: Yes    Alcohol/week: 6.0 standard drinks    Types: 4 Cans of beer, 2 Standard drinks or equivalent per week    Comment: 2 mixed drinks and 4 beers a week     Family History  Problem Relation Age of Onset   Depression Mother    Hypertension Mother    Obesity Mother    Anxiety disorder Father    Prior to Admission medications   Medication Sig Start Date End Date Taking? Authorizing Provider  buPROPion (WELLBUTRIN XL) 150 MG 24 hr tablet Take 1 tablet (150 mg total) by mouth daily. 05/17/21  Yes Sowles, Drue Stager, MD  citalopram (CELEXA) 40 MG tablet TAKE 1 TABLET BY MOUTH EVERY DAY 05/09/21  Yes Sowles, Drue Stager, MD  meloxicam (MOBIC) 15 MG tablet Take 15 mg by mouth daily. 05/21/21  Yes [provider]  SUMAtriptan (IMITREX) 100 MG tablet Take 1 tablet (100 mg total) by mouth every 2 (two) hours as needed for migraine. May repeat in 2 hours if headache persists or recurs. 05/17/21  Yes Sowles, Drue Stager, MD   traZODone (DESYREL) 100 MG tablet Take 1 tablet (100 mg total) by mouth every evening. 05/17/21  Yes Sowles, Drue Stager, MD  montelukast (SINGULAIR) 10 MG tablet TAKE 1 TABLET BY MOUTH EVERYDAY AT BEDTIME Patient taking differently: Take 10 mg by mouth at bedtime as needed (spring and fall allergies). 05/17/21   Steele Sizer, MD  olopatadine (PATANOL) 0.1 % ophthalmic solution Place 1 drop into both eyes 2 (two) times daily. Patient taking differently: Place 1 drop into both eyes 2 (two) times daily as needed for allergies. 11/11/20   Steele Sizer, MD  Rimegepant Sulfate (NURTEC) 75 MG TBDP Take 1 tablet by mouth daily as needed. 05/18/21   Steele Sizer, MD     Review of Systems  Positive ROS: As above  All other systems have been reviewed and were otherwise negative with the exception of those mentioned in the HPI and as above.  Objective: Vital signs in last 24 hours: Temp:  [97.7 F (36.5 C)] 97.7 F (36.5 C) (01/09 0813) Pulse Rate:  [59] 59 (01/09 0813) Resp:  [17] 17 (01/09 0813) BP: (135)/(86) 135/86 (01/09 0813) SpO2:  [100 %] 100 % (01/09 0813) Estimated body mass index is 21.82 kg/m as calculated from the following:   Height as of 09/09/21: 5\' 10"  (1.778 m).   Weight as of 09/09/21: 69 kg.  General Appearance: Alert Head: Normocephalic, without obvious abnormality, atraumatic Eyes: PERRL, conjunctiva/corneas clear, EOM's intact,    Ears: Normal  Throat: Normal  Neck: Supple, Back: The patient's lumbar incision is well-healed. Lungs: Clear to auscultation bilaterally, respirations unlabored Heart: Regular rate and rhythm, no murmur, rub or gallop Abdomen: Soft, non-tender Extremities: Extremities normal, atraumatic, no cyanosis or edema Skin: unremarkable  NEUROLOGIC:   Mental status: alert and oriented,Motor Exam - grossly normal Sensory Exam - grossly normal Reflexes:  Coordination - grossly normal Gait - grossly normal Balance - grossly normal Cranial  Nerves: I: smell Not tested  II: visual acuity  OS: Normal  OD: Normal   II: visual fields Full to confrontation  II: pupils Equal, round, reactive to light  III,VII: ptosis None  III,IV,VI: extraocular muscles  Full ROM  V: mastication Normal  V: facial light touch sensation  Normal  V,VII: corneal reflex  Present  VII: facial muscle function - upper  Normal  VII: facial muscle function - lower Normal  VIII: hearing Not tested  IX: soft palate elevation  Normal  IX,X: gag reflex Present  XI: trapezius strength  5/5  XI: sternocleidomastoid strength 5/5  XI: neck flexion strength  5/5  XII: tongue strength  Normal    Data Review Lab Results  Component Value Date   WBC 5.3 09/09/2021   HGB 15.7 09/09/2021   HCT 47.9 09/09/2021   MCV 93.9 09/09/2021   PLT 230 09/09/2021   Lab Results  Component Value Date   NA 142 09/09/2021   K 4.4 09/09/2021   CL 108 09/09/2021   CO2 27 09/09/2021   BUN 17 09/09/2021   CREATININE 0.89 09/09/2021   GLUCOSE 82 09/09/2021   No results found for: INR, PROTIME  Assessment/Plan: L4-5 spondylolisthesis, facet arthropathy, spinal stenosis, lumbago, lumbar radiculopathy, neurogenic claudication: I have discussed the situation with the patient.  I have reviewed his imaging studies with him and pointed out the abnormalities.  We have discussed the various treatment options including surgery.  I have described the surgical treatment option of an L4-5 decompression, instrumentation and fusion.  I have shown him surgical models.  I have given him a surgical pamphlet.  We have discussed the risk, benefits, alternatives, expected postoperative course, and likelihood of achieving our goals with surgery.  I have answered all his questions.  He has decided proceed with surgery.   Ophelia Charter 09/13/2021 9:27 AM

## 2021-09-13 NOTE — Transfer of Care (Signed)
Immediate Anesthesia Transfer of Care Note  Patient: Ralph Mathews  Procedure(s) Performed: POSTERIOR LUMBAR INTERBODY FUSION ,INSTRUMENTED PROTHESIS ,POSTERIOR INSTRUMENTATION, LUMBAR FOUR-FIVE  Patient Location: PACU  Anesthesia Type:General  Level of Consciousness: drowsy  Airway & Oxygen Therapy: Patient Spontanous Breathing and Patient connected to nasal cannula oxygen  Post-op Assessment: Report given to RN and Post -op Vital signs reviewed and stable  Post vital signs: Reviewed and stable  Last Vitals:  Vitals Value Taken Time  BP 104/64   Temp    Pulse 78   Resp 12   SpO2 99%     Last Pain:  Vitals:   09/13/21 0837  TempSrc:   PainSc: 7       Patients Stated Pain Goal: 2 (41/66/06 3016)  Complications: No notable events documented.

## 2021-09-13 NOTE — Anesthesia Preprocedure Evaluation (Signed)
Anesthesia Evaluation  Patient identified by MRN, date of birth, ID band Patient awake    Reviewed: Allergy & Precautions, H&P , NPO status , Patient's Chart, lab work & pertinent test results  Airway Mallampati: II   Neck ROM: full    Dental   Pulmonary neg pulmonary ROS,    breath sounds clear to auscultation       Cardiovascular negative cardio ROS   Rhythm:regular Rate:Normal     Neuro/Psych  Headaches, PSYCHIATRIC DISORDERS Depression    GI/Hepatic GERD  ,  Endo/Other    Renal/GU      Musculoskeletal  (+) Arthritis ,   Abdominal   Peds  Hematology   Anesthesia Other Findings   Reproductive/Obstetrics                             Anesthesia Physical Anesthesia Plan  ASA: 2  Anesthesia Plan: General   Post-op Pain Management:    Induction: Intravenous  PONV Risk Score and Plan: 2 and Ondansetron, Dexamethasone, Midazolam and Treatment may vary due to age or medical condition  Airway Management Planned: Oral ETT  Additional Equipment:   Intra-op Plan:   Post-operative Plan: Extubation in OR  Informed Consent: I have reviewed the patients History and Physical, chart, labs and discussed the procedure including the risks, benefits and alternatives for the proposed anesthesia with the patient or authorized representative who has indicated his/her understanding and acceptance.     Dental advisory given  Plan Discussed with: CRNA, Anesthesiologist and Surgeon  Anesthesia Plan Comments:         Anesthesia Quick Evaluation

## 2021-09-13 NOTE — Anesthesia Procedure Notes (Signed)
Procedure Name: Intubation Date/Time: 09/13/2021 10:21 AM Performed by: Eligha Bridegroom, CRNA Pre-anesthesia Checklist: Patient identified, Emergency Drugs available, Suction available and Patient being monitored Patient Re-evaluated:Patient Re-evaluated prior to induction Oxygen Delivery Method: Circle System Utilized Preoxygenation: Pre-oxygenation with 100% oxygen Induction Type: IV induction Ventilation: Mask ventilation without difficulty and Oral airway inserted - appropriate to patient size Laryngoscope Size: Miller and 3 Grade View: Grade I Tube type: Oral Tube size: 7.5 mm Number of attempts: 1 Airway Equipment and Method: Stylet and Oral airway Placement Confirmation: ETT inserted through vocal cords under direct vision, positive ETCO2 and breath sounds checked- equal and bilateral Secured at: 23 cm Tube secured with: Tape Dental Injury: Teeth and Oropharynx as per pre-operative assessment  Comments: Performed by Donia Ast, SRNA

## 2021-09-13 NOTE — Progress Notes (Signed)
Orthopedic Tech Progress Note Patient Details:  [name] May 24, 1976 [id number]  Patient ID: Ralph Mathews, male   DOB: Jul 24, 1976, 46 y.o.   MRN: [id number] Pt already has back brace.  [place] A Aulani Shipton 09/13/2021, 6:00 PM

## 2021-09-14 DIAGNOSIS — M961 Postlaminectomy syndrome, not elsewhere classified: Secondary | ICD-10-CM | POA: Diagnosis not present

## 2021-09-14 LAB — CBC
HCT: 39.8 % (ref 39.0–52.0)
Hemoglobin: 13.3 g/dL (ref 13.0–17.0)
MCH: 31.2 pg (ref 26.0–34.0)
MCHC: 33.4 g/dL (ref 30.0–36.0)
MCV: 93.4 fL (ref 80.0–100.0)
Platelets: 195 10*3/uL (ref 150–400)
RBC: 4.26 MIL/uL (ref 4.22–5.81)
RDW: 13.7 % (ref 11.5–15.5)
WBC: 12.7 10*3/uL — ABNORMAL HIGH (ref 4.0–10.5)
nRBC: 0 % (ref 0.0–0.2)

## 2021-09-14 LAB — BASIC METABOLIC PANEL
Anion gap: 5 (ref 5–15)
BUN: 11 mg/dL (ref 6–20)
CO2: 31 mmol/L (ref 22–32)
Calcium: 8.1 mg/dL — ABNORMAL LOW (ref 8.9–10.3)
Chloride: 102 mmol/L (ref 98–111)
Creatinine, Ser: 0.95 mg/dL (ref 0.61–1.24)
GFR, Estimated: >60 mL/min (ref >60)
Glucose, Bld: 116 mg/dL — ABNORMAL HIGH (ref 70–99)
Potassium: 4.2 mmol/L (ref 3.5–5.1)
Sodium: 138 mmol/L (ref 135–145)

## 2021-09-14 MED ORDER — CHLORHEXIDINE GLUCONATE CLOTH 2 % EX PADS
6.0000 | MEDICATED_PAD | Freq: Every day | CUTANEOUS | Status: DC
  Administered 2021-09-14 – 2021-09-15 (×2): 6 via TOPICAL

## 2021-09-14 MED ORDER — ALUM & MAG HYDROXIDE-SIMETH 200-200-20 MG/5ML PO SUSP
30.0000 mL | Freq: Four times a day (QID) | ORAL | Status: DC | PRN
  Administered 2021-09-14 – 2021-09-15 (×2): 30 mL via ORAL
  Filled 2021-09-14 (×2): qty 30

## 2021-09-14 MED FILL — Thrombin For Soln 5000 Unit: CUTANEOUS | Qty: 5000 | Status: AC

## 2021-09-14 NOTE — Progress Notes (Signed)
Subjective: The patient is alert and pleasant.  He is in no apparent distress.  He denies lower extremity and perineal numbness, tingling or weakness.  He had urinary retention and had his Foley catheter replaced.  Objective: Vital signs in last 24 hours: Temp:  [97.5 F (36.4 C)-98 F (36.7 C)] 97.5 F (36.4 C) (01/10 0730) Pulse Rate:  [59-99] 68 (01/10 0730) Resp:  [12-18] 18 (01/10 0730) BP: (102-135)/(64-86) 108/68 (01/10 0730) SpO2:  [93 %-100 %] 98 % (01/10 0730) Estimated body mass index is 21.82 kg/m as calculated from the following:   Height as of 09/09/21: 5\' 10"  (1.778 m).   Weight as of 09/09/21: 69 kg.   Intake/Output from previous day: 01/09 0701 - 01/10 0700 In: 1950 [I.V.:1950] Out: 1325 [Urine:1150; Blood:175] Intake/Output this shift: No intake/output data recorded.  Physical exam the patient is alert and pleasant.  He is in no apparent distress.  His lower extremity strength is normal.  There is no numbness.  Lab Results: Recent Labs    09/14/21 0603  WBC 12.7*  HGB 13.3  HCT 39.8  PLT 195   BMET Recent Labs    09/14/21 0603  NA 138  K 4.2  CL 102  CO2 31  GLUCOSE 116*  BUN 11  CREATININE 0.95  CALCIUM 8.1*    Studies/Results: DG Lumbar Spine 2-3 Views  Result Date: 09/13/2021 CLINICAL DATA:  Lumbar fusion at L4-L5 level EXAM: LUMBAR SPINE - 2-3 VIEW COMPARISON:  None. FINDINGS: Fluoroscopic images show posterior lumbar fusion at L4-L5 level. Intervertebral disc spacer is noted in place. Fluoroscopic time was 24 seconds. Radiation dose is 9.41 mGy. IMPRESSION: Fluoroscopic assistance was provided for posterior lumbar fusion at L4-L5 level. Electronically Signed   By: Elmer Picker M.D.   On: 09/13/2021 13:53   DG Lumbar Spine 1 View  Result Date: 09/13/2021 CLINICAL DATA:  Study done for intraoperative localization EXAM: LUMBAR SPINE - 1 VIEW COMPARISON:  None. FINDINGS: Cross-table portable lateral view of lumbar spine shows tip of  localizing probe overlying posterior elements at the L5 level. Degenerative changes are noted with disc space narrowing, bony spurs and facet hypertrophy at L4-L5 level. IMPRESSION: Portable lateral view of lumbar spine was done for intraoperative localization. Electronically Signed   By: Elmer Picker M.D.   On: 09/13/2021 13:52   DG C-Arm 1-60 Min-No Report  Result Date: 09/13/2021 Fluoroscopy was utilized by the requesting physician.  No radiographic interpretation.   DG C-Arm 1-60 Min-No Report  Result Date: 09/13/2021 Fluoroscopy was utilized by the requesting physician.  No radiographic interpretation.    Assessment/Plan: Postop day #1: The patient is doing well except for his urinary retention.  He has been started on Flomax.  The Foley is back in.  We discussed bladder rest and trying again to discontinue his Foley catheter tomorrow versus discharge with outpatient urology follow-up.  He is going to talk it over with his wife.  I gave him his discharge instructions and answered all his questions.  LOS: 0 days     Ophelia Charter 09/14/2021, 7:50 AM     Patient ID: Ralph Mathews, male   DOB: 1976/02/15, 46 y.o.   MRN: [id number]

## 2021-09-14 NOTE — Plan of Care (Signed)
Adequately ready for Discharge

## 2021-09-14 NOTE — Evaluation (Signed)
Physical Therapy Evaluation Patient Details Name: Ralph Mathews MRN: [id number] DOB: [birth year] Today's Date: 09/14/2021  History of Present Illness  Pt is a 46 yo male s/p L4-5 lami on 09/13/21. PMH including lumbar lami (2015), depression, DJD, and ORIF R metatarsal fx.   Clinical Impression  Pt admitted with above diagnosis. At the time of PT eval, pt was able to demonstrate transfers with modified independence and ambulation with supervision for safety. Initiated stair training however pt did not complete full flight due to pain from catheter. Anticipate pt will progress well. Pt was educated on precautions, positioning recommendations, appropriate activity progression, and car transfer. Pt currently with functional limitations due to the deficits listed below (see PT Problem List). Pt will benefit from skilled PT to increase their independence and safety with mobility to allow discharge to the venue listed below.         Recommendations for follow up therapy are one component of a multi-disciplinary discharge planning process, led by the attending physician.  Recommendations may be updated based on patient status, additional functional criteria and insurance authorization.  Follow Up Recommendations No PT follow up    Assistance Recommended at Discharge PRN  Patient can return home with the following  A little help with walking and/or transfers;A little help with bathing/dressing/bathroom;Assist for transportation;Help with stairs or ramp for entrance    Equipment Recommendations None recommended by PT  Recommendations for Other Services       Functional Status Assessment Patient has had a recent decline in their functional status and demonstrates the ability to make significant improvements in function in a reasonable and predictable amount of time.     Precautions / Restrictions Precautions Precautions: Back Precaution Booklet Issued: Yes (comment) Precaution Comments: reviewed  back precautions Required Braces or Orthoses: Spinal Brace Spinal Brace: Lumbar corset;Applied in sitting position Restrictions Weight Bearing Restrictions: No      Mobility  Bed Mobility Overal bed mobility: Modified Independent             General bed mobility comments: HOB flat and rails lowered to simulate home environment.    Transfers Overall transfer level: Modified independent Equipment used: None               General transfer comment: Increased time but no unsteadiness or LOB noted.    Ambulation/Gait Ambulation/Gait assistance: Supervision Gait Distance (Feet): 300 Feet Assistive device: None Gait Pattern/deviations: Step-through pattern;Decreased stride length;Trunk flexed Gait velocity: Decreased Gait velocity interpretation: 1.31 - 2.62 ft/sec, indicative of limited community ambulator   General Gait Details: VC's for improved posture. Pt complaining of pain throughout OOB mobility 2 catheter, however no increased pain with ambulation.  Stairs Stairs: Yes Stairs assistance: Min guard Stair Management: One rail Left;Step to pattern;Forwards Number of Stairs: 5 General stair comments: VC's for sequencing and general safety. No assist required however close guard provided for safety.  Wheelchair Mobility    Modified Rankin (Stroke Patients Only)       Balance Overall balance assessment: No apparent balance deficits (not formally assessed)                                           Pertinent Vitals/Pain Pain Assessment: Faces Faces Pain Scale: Hurts even more Pain Location: back Pain Descriptors / Indicators: Constant;Discomfort Pain Intervention(s): Limited activity within patient's tolerance;Monitored during session;Repositioned    Home Living Family/patient expects  to be discharged to:: Private residence Living Arrangements: Spouse/significant other Available Help at Discharge: Available 24 hours/day;Family  (Parents staying at d/c for increased support) Type of Home: House Home Access: Stairs to enter   [employer] of Steps: "a couple" Alternate Level Stairs-Number of Steps: flight Home Layout: Two level Home Equipment: None      Prior Function Prior Level of Function : Independent/Modified Independent               ADLs Comments: Works as a Designer, fashion/clothing Extremity Assessment Upper Extremity Assessment: Defer to OT evaluation    Lower Extremity Assessment Lower Extremity Assessment: Generalized weakness (Consistent with pre-op diagnosis)    Cervical / Trunk Assessment Cervical / Trunk Assessment: Back Surgery  Communication   Communication: No difficulties  Cognition Arousal/Alertness: Awake/alert Behavior During Therapy: WFL for tasks assessed/performed Overall Cognitive Status: Within Functional Limits for tasks assessed                                          General Comments      Exercises     Assessment/Plan    PT Assessment Patient needs continued PT services  PT Problem List Decreased strength;Decreased activity tolerance;Decreased balance;Decreased mobility;Decreased knowledge of use of DME;Decreased safety awareness;Decreased knowledge of precautions;Pain       PT Treatment Interventions DME instruction;Gait training;Stair training;Functional mobility training;Therapeutic activities;Therapeutic exercise;Neuromuscular re-education;Patient/family education    PT Goals (Current goals can be found in the Care Plan section)  Acute Rehab PT Goals Patient Stated Goal: Home tomorrow - get catheter out PT Goal Formulation: With patient Time For Goal Achievement: 09/21/21 Potential to Achieve Goals: Good    Frequency Min 5X/week     Co-evaluation               AM-PAC PT "6 Clicks" Mobility  Outcome Measure Help needed turning from your back to your side while  in a flat bed without using bedrails?: None Help needed moving from lying on your back to sitting on the side of a flat bed without using bedrails?: None Help needed moving to and from a bed to a chair (including a wheelchair)?: A Little Help needed standing up from a chair using your arms (e.g., wheelchair or bedside chair)?: A Little Help needed to walk in hospital room?: A Little Help needed climbing 3-5 steps with a railing? : A Little 6 Click Score: 20    End of Session Equipment Utilized During Treatment: Gait belt Activity Tolerance: Patient tolerated treatment well Patient left: in bed;with call bell/phone within reach Nurse Communication: Mobility status PT Visit Diagnosis: Unsteadiness on feet (R26.81);Pain Pain - part of body:  (back)    Time: 7001-7494 PT Time Calculation (min) (ACUTE ONLY): 18 min   Charges:   PT Evaluation $PT Eval Low Complexity: 1 Low          [name], PT, DPT Acute Rehabilitation Services Pager: [phone] Office: [phone]   [name] 09/14/2021, 12:56 PM

## 2021-09-14 NOTE — Anesthesia Postprocedure Evaluation (Signed)
Anesthesia Post Note  Patient: Samanyu Tinnell  Procedure(s) Performed: POSTERIOR LUMBAR INTERBODY FUSION ,INSTRUMENTED PROTHESIS ,POSTERIOR INSTRUMENTATION, LUMBAR FOUR-FIVE     Patient location during evaluation: PACU Anesthesia Type: General Level of consciousness: awake and alert Pain management: pain level controlled Vital Signs Assessment: post-procedure vital signs reviewed and stable Respiratory status: spontaneous breathing, nonlabored ventilation, respiratory function stable and patient connected to nasal cannula oxygen Cardiovascular status: blood pressure returned to baseline and stable Postop Assessment: no apparent nausea or vomiting Anesthetic complications: no   No notable events documented.  Last Vitals:  Vitals:   09/14/21 0312 09/14/21 0730  BP: 117/70 108/68  Pulse: 69 68  Resp: 18 18  Temp: 36.6 C (!) 36.4 C  SpO2: 99% 98%    Last Pain:  Vitals:   09/14/21 0730  TempSrc: Oral  PainSc:                  Bradley S

## 2021-09-14 NOTE — Evaluation (Signed)
Occupational Therapy Evaluation Patient Details Name: Ralph Mathews MRN: [id number] DOB: 1976/01/16 Today's Date: 09/14/2021   History of Present Illness  46 yo male s/p L4-5 lami on 1/9. PMH including lumbar lami (2015), depression, DJD, and ORIF R metatarsal fx.    Clinical Impression   PTA, pt was living alone and was independent; parents plan to stay at dc for increased support. Currently, pt requires Mod I with increased time for ADLs and functional mobility. Provided education and handout on back precautions, grooming, bed mobility, brace management, LB ADLs, toilet, and tub transfer; pt demonstrated understanding. Answered all pt questions. Recommend dc home once medically stable per physician. All acute OT needs met and will sign off. Thank you.      Recommendations for follow up therapy are one component of a multi-disciplinary discharge planning process, led by the attending physician.  Recommendations may be updated based on patient status, additional functional criteria and insurance authorization.   Follow Up Recommendations  No OT follow up    Assistance Recommended at Discharge Intermittent Supervision/Assistance  Patient can return home with the following      Functional Status Assessment  Patient has had a recent decline in their functional status and demonstrates the ability to make significant improvements in function in a reasonable and predictable amount of time.  Equipment Recommendations  None recommended by OT    Recommendations for Other Services       Precautions / Restrictions Precautions Precautions: Back Precaution Booklet Issued: Yes (comment) Precaution Comments: reviewed back precautions Required Braces or Orthoses: Spinal Brace Spinal Brace: Lumbar corset;Applied in sitting position      Mobility Bed Mobility Overal bed mobility: Modified Independent             General bed mobility comments: log roll technique    Transfers Overall  transfer level: Modified independent                        Balance Overall balance assessment: No apparent balance deficits (not formally assessed)                                         ADL either performed or assessed with clinical judgement   ADL Overall ADL's : Modified independent                                       General ADL Comments: Providing education on back precautions, bed mobility, grooming, toileting, LB ADLs, and tub transfer. Pt performing at Mod I level with increased time.     Vision Baseline Vision/History: 1 Wears glasses Patient Visual Report: No change from baseline       Perception     Praxis      Pertinent Vitals/Pain Pain Assessment: 0-10 Pain Score: 7  Pain Location: back Pain Descriptors / Indicators: Constant;Discomfort Pain Intervention(s): Monitored during session;Repositioned     Hand Dominance     Extremity/Trunk Assessment Upper Extremity Assessment Upper Extremity Assessment: Overall WFL for tasks assessed   Lower Extremity Assessment Lower Extremity Assessment: Defer to PT evaluation   Cervical / Trunk Assessment Cervical / Trunk Assessment: Back Surgery   Communication Communication Communication: No difficulties   Cognition Arousal/Alertness: Awake/alert Behavior During Therapy: WFL for tasks assessed/performed Overall Cognitive Status: Within Functional Limits  for tasks assessed                                       General Comments  cath still in place    Exercises     Shoulder Instructions      Home Living Family/patient expects to be discharged to:: Private residence Living Arrangements: Alone Available Help at Discharge: Available 24 hours/day;Family (Parents staying at dc for increased support) Type of Home: House Home Access: Stairs to enter [employer] of Steps: "a couple"   Home Layout: Two level Alternate Level Stairs-Number  of Steps: flight   Bathroom Shower/Tub: Corporate investment banker: Handicapped height     Home Equipment: None          Prior Functioning/Environment Prior Level of Function : Independent/Modified Independent               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, corporate List: Decreased range of motion;Decreased activity tolerance;Impaired balance (sitting and/or standing);Decreased knowledge of use of DME or AE;Decreased knowledge of precautions      OT Treatment/Interventions:      OT Goals(Current goals can be found in the care plan section) Acute Rehab OT Goals Patient Stated Goal: Go home OT Goal Formulation: All assessment and education complete, DC therapy  OT Frequency:      Co-evaluation              AM-PAC OT "6 Clicks" Daily Activity     Outcome Measure Help from another person eating meals?: None Help from another person taking care of personal grooming?: None Help from another person toileting, which includes using toliet, bedpan, or urinal?: None Help from another person bathing (including washing, rinsing, drying)?: None Help from another person to put on and taking off regular upper body clothing?: None Help from another person to put on and taking off regular lower body clothing?: None 6 Click Score: 24   End of Session Equipment Utilized During Treatment: Back brace Nurse Communication: Precautions  Activity Tolerance: Patient tolerated treatment well Patient left: in bed;with call bell/phone within reach  OT Visit Diagnosis: Unsteadiness on feet (R26.81);Other abnormalities of gait and mobility (R26.89);Muscle weakness (generalized) (M62.81);Pain Pain - part of body:  (back)                Time: 3200-3794 OT Time Calculation (min): 16 min Charges:  OT General Charges $OT Visit: 1 Visit OT Evaluation $OT Eval Low Complexity: 1 Low  Ralph Mathews MSOT, OTR/L Acute Rehab Pager: [phone] Office:  [place] 09/14/2021, 9:00 AM

## 2021-09-15 DIAGNOSIS — M961 Postlaminectomy syndrome, not elsewhere classified: Secondary | ICD-10-CM | POA: Diagnosis not present

## 2021-09-15 LAB — CBC
HCT: 37.3 % — ABNORMAL LOW (ref 39.0–52.0)
Hemoglobin: 12.3 g/dL — ABNORMAL LOW (ref 13.0–17.0)
MCH: 31.2 pg (ref 26.0–34.0)
MCHC: 33 g/dL (ref 30.0–36.0)
MCV: 94.7 fL (ref 80.0–100.0)
Platelets: 183 10*3/uL (ref 150–400)
RBC: 3.94 MIL/uL — ABNORMAL LOW (ref 4.22–5.81)
RDW: 13.9 % (ref 11.5–15.5)
WBC: 9.1 10*3/uL (ref 4.0–10.5)
nRBC: 0 % (ref 0.0–0.2)

## 2021-09-15 LAB — BASIC METABOLIC PANEL
Anion gap: 4 — ABNORMAL LOW (ref 5–15)
BUN: 12 mg/dL (ref 6–20)
CO2: 27 mmol/L (ref 22–32)
Calcium: 7.7 mg/dL — ABNORMAL LOW (ref 8.9–10.3)
Chloride: 104 mmol/L (ref 98–111)
Creatinine, Ser: 1.06 mg/dL (ref 0.61–1.24)
GFR, Estimated: >60 mL/min (ref >60)
Glucose, Bld: 127 mg/dL — ABNORMAL HIGH (ref 70–99)
Potassium: 3.7 mmol/L (ref 3.5–5.1)
Sodium: 135 mmol/L (ref 135–145)

## 2021-09-15 MED ORDER — OXYCODONE-ACETAMINOPHEN 5-325 MG PO TABS: 1.0000 | ORAL_TABLET | ORAL | Status: DC | PRN

## 2021-09-15 MED ORDER — DOCUSATE SODIUM 100 MG PO CAPS: 100.0000 mg | ORAL_CAPSULE | Freq: Two times a day (BID) | ORAL | 0 refills | Status: DC

## 2021-09-15 MED ORDER — HYDROMORPHONE HCL 1 MG/ML IJ SOLN: 0.5000 mg | INTRAMUSCULAR | Status: DC | PRN

## 2021-09-15 MED ORDER — CYCLOBENZAPRINE HCL 10 MG PO TABS: 10.0000 mg | ORAL_TABLET | Freq: Three times a day (TID) | ORAL | 0 refills | Status: DC | PRN

## 2021-09-15 MED ORDER — OXYCODONE-ACETAMINOPHEN 5-325 MG PO TABS
1.0000 | ORAL_TABLET | ORAL | 0 refills | Status: DC | PRN
Start: 2021-09-15 — End: 2021-09-29

## 2021-09-15 MED ORDER — TAMSULOSIN HCL 0.4 MG PO CAPS: 0.4000 mg | ORAL_CAPSULE | Freq: Every day | ORAL | 0 refills | Status: DC

## 2021-09-15 MED FILL — Heparin Sodium (Porcine) Inj 1000 Unit/ML: INTRAMUSCULAR | Qty: 30 | Status: AC

## 2021-09-15 MED FILL — Sodium Chloride IV Soln 0.9%: INTRAVENOUS | Qty: 1000 | Status: AC

## 2021-09-15 NOTE — Progress Notes (Signed)
Physical Therapy Treatment Patient Details Name: Ralph Mathews MRN: [id number] DOB: [birth year] Today's Date: 09/15/2021   History of Present Illness Pt is a 46 y/o male s/p L4-5 lami on 09/13/21. PMH including lumbar lami (2015), depression, DJD, and ORIF R metatarsal fx.    PT Comments    Progressing towards physical therapy goals. Was able to perform transfers and ambulation with modified independence. Pt reports increased pain with ambulation, however states he has been doing limited ambulation since surgery. Encouraged frequent, short walks at home to decrease risk of getting stiff. Reinforced education on precautions, brace application/wearing schedule, car transfer, and appropriate activity progression. Pt has met acute PT goals. Will sign off at this time. If needs change, please reconsult.   Recommendations for follow up therapy are one component of a multi-disciplinary discharge planning process, led by the attending physician.  Recommendations may be updated based on patient status, additional functional criteria and insurance authorization.  Follow Up Recommendations  No PT follow up     Assistance Recommended at Discharge PRN  Patient can return home with the following A little help with walking and/or transfers;A little help with bathing/dressing/bathroom;Assist for transportation;Help with stairs or ramp for entrance   Equipment Recommendations  None recommended by PT    Recommendations for Other Services       Precautions / Restrictions Precautions Precautions: Back Precaution Booklet Issued: Yes (comment) Precaution Comments: reviewed back precautions Required Braces or Orthoses: Spinal Brace Spinal Brace: Lumbar corset;Applied in sitting position Restrictions Weight Bearing Restrictions: No     Mobility  Bed Mobility Overal bed mobility: Modified Independent             General bed mobility comments: HOB flat and rails lowered to simulate home  environment. Increased time.    Transfers Overall transfer level: Modified independent Equipment used: None               General transfer comment: Increased time due to pain but no unsteadiness or LOB noted.    Ambulation/Gait Ambulation/Gait assistance: Modified independent (Device/Increase time) Gait Distance (Feet): 400 Feet Assistive device: None Gait Pattern/deviations: Step-through pattern;Decreased stride length;Trunk flexed Gait velocity: Decreased Gait velocity interpretation: 1.31 - 2.62 ft/sec, indicative of limited community ambulator   General Gait Details: VC's for improved posture. Overall moving slow due to pain but generally steady.   Stairs Stairs: Yes Stairs assistance: Modified independent (Device/Increase time) Stair Management: One rail Left;Step to pattern;Forwards Number of Stairs: 7 General stair comments: VC's for general safety. No assist required, no unsteadiness noted.   Wheelchair Mobility    Modified Rankin (Stroke Patients Only)       Balance Overall balance assessment: No apparent balance deficits (not formally assessed)                                          Cognition Arousal/Alertness: Awake/alert Behavior During Therapy: WFL for tasks assessed/performed Overall Cognitive Status: Within Functional Limits for tasks assessed                                          Exercises      General Comments        Pertinent Vitals/Pain Pain Assessment: Faces Faces Pain Scale: Hurts even more Pain Location: back Pain Descriptors / Indicators: Constant;Discomfort  Pain Intervention(s): Limited activity within patient's tolerance;Monitored during session;Repositioned    Home Living                          Prior Function            PT Goals (current goals can now be found in the care plan section) Acute Rehab PT Goals Patient Stated Goal: Decrease pain PT Goal Formulation:  With patient Time For Goal Achievement: 09/21/21 Potential to Achieve Goals: Good Progress towards PT goals: Progressing toward goals    Frequency    Min 5X/week      PT Plan Current plan remains appropriate    Co-evaluation              AM-PAC PT "6 Clicks" Mobility   Outcome Measure  Help needed turning from your back to your side while in a flat bed without using bedrails?: None Help needed moving from lying on your back to sitting on the side of a flat bed without using bedrails?: None Help needed moving to and from a bed to a chair (including a wheelchair)?: None Help needed standing up from a chair using your arms (e.g., wheelchair or bedside chair)?: None Help needed to walk in hospital room?: None Help needed climbing 3-5 steps with a railing? : None 6 Click Score: 24    End of Session Equipment Utilized During Treatment: Gait belt Activity Tolerance: Patient tolerated treatment well Patient left: in bed;with call bell/phone within reach Nurse Communication: Mobility status PT Visit Diagnosis: Unsteadiness on feet (R26.81);Pain Pain - part of body:  (back)     Time: 7902-4097 PT Time Calculation (min) (ACUTE ONLY): 12 min  Charges:  $Gait Training: 8-22 mins                     [name], PT, DPT Acute Rehabilitation Services Pager: [phone] Office: [phone]    [name] 09/15/2021, 11:13 AM

## 2021-09-15 NOTE — Discharge Summary (Signed)
Physician Discharge Summary  Patient ID: Ralph Mathews MRN: [id number] DOB/AGE: 46-Sep-1977 46 y.o.  Admit date: 09/13/2021 Discharge date: 09/15/2021  Admission Diagnoses: Postlaminectomy spondylolisthesis, lumbar facet arthropathy, lumbar spinal stenosis, lumbago, lumbar radiculopathy  Discharge Diagnoses: The same and urinary retention Principal Problem:   Spondylolisthesis of lumbar region   Discharged Condition: good  Hospital Course: I performed a redo L4-5 decompression, instrumentation and fusion on the patient on 09/13/2021.  The surgery went well.  The patient's postoperative course was remarkable only for urinary retention.  He had the Foley catheter replaced and was started on Flomax.  By postop day #2 his urinary retention had resolved.  He requested discharge to home.  The patient, and his parents, were given verbal and written discharge instructions.  All his questions were answered.  Consults: PT, OT, care management Significant Diagnostic Studies: None Treatments: Redo L4-5 laminectomy with instrumentation and fusion at L4-5 Discharge Exam: Blood pressure 116/78, pulse 78, temperature 98.5 F (36.9 C), temperature source Oral, resp. rate 18, SpO2 97 %. The patient is alert and pleasant.  He looks well.  His strength is normal.  Disposition: Home  Discharge Instructions     Call MD for:  difficulty breathing, headache or visual disturbances   Complete by: As directed    Call MD for:  extreme fatigue   Complete by: As directed    Call MD for:  hives   Complete by: As directed    Call MD for:  persistant dizziness or light-headedness   Complete by: As directed    Call MD for:  persistant nausea and vomiting   Complete by: As directed    Call MD for:  redness, tenderness, or signs of infection (pain, swelling, redness, odor or green/yellow discharge around incision site)   Complete by: As directed    Call MD for:  severe uncontrolled pain   Complete by: As  directed    Call MD for:  temperature >100.4   Complete by: As directed    Diet - low sodium heart healthy   Complete by: As directed    Discharge instructions   Complete by: As directed    Call [phone] for a followup appointment. Take a stool softener while you are using pain medications.   Driving Restrictions   Complete by: As directed    Do not drive for 2 weeks.   Increase activity slowly   Complete by: As directed    Lifting restrictions   Complete by: As directed    Do not lift more than 5 pounds. No excessive bending or twisting.   May shower / Bathe   Complete by: As directed    Remove the dressing for 3 days after surgery.  You may shower, but leave the incision alone.   Remove dressing in 24 hours   Complete by: As directed       Allergies as of 09/15/2021   No Known Allergies      Medication List     STOP taking these medications    meloxicam 15 MG tablet Commonly known as: MOBIC       TAKE these medications    buPROPion 150 MG 24 hr tablet Commonly known as: WELLBUTRIN XL Take 1 tablet (150 mg total) by mouth daily.   citalopram 40 MG tablet Commonly known as: CELEXA TAKE 1 TABLET BY MOUTH EVERY DAY   cyclobenzaprine 10 MG tablet Commonly known as: FLEXERIL Take 1 tablet (10 mg total) by mouth 3 (three) times daily  as needed for muscle spasms.   docusate sodium 100 MG capsule Commonly known as: COLACE Take 1 capsule (100 mg total) by mouth 2 (two) times daily.   montelukast 10 MG tablet Commonly known as: SINGULAIR TAKE 1 TABLET BY MOUTH EVERYDAY AT BEDTIME What changed:  how much to take how to take this when to take this reasons to take this additional instructions   Nurtec 75 MG Tbdp Generic drug: Rimegepant Sulfate Take 1 tablet by mouth daily as needed.   olopatadine 0.1 % ophthalmic solution Commonly known as: PATANOL Place 1 drop into both eyes 2 (two) times daily. What changed:  when to take this reasons to take  this   oxyCODONE-acetaminophen 5-325 MG tablet Commonly known as: PERCOCET/ROXICET Take 1-2 tablets by mouth every 4 (four) hours as needed for moderate pain.   SUMAtriptan 100 MG tablet Commonly known as: IMITREX Take 1 tablet (100 mg total) by mouth every 2 (two) hours as needed for migraine. May repeat in 2 hours if headache persists or recurs.   tamsulosin 0.4 MG Caps capsule Commonly known as: FLOMAX Take 1 capsule (0.4 mg total) by mouth daily. Start taking on: September 16, 2021   traZODone 100 MG tablet Commonly known as: DESYREL Take 1 tablet (100 mg total) by mouth every evening.         Signed: Ophelia Charter 09/15/2021, 12:55 PM

## 2021-09-15 NOTE — Progress Notes (Signed)
Patient alert and oriented, voiding adequately, skin clean, dry and intact without evidence of skin break down, or symptoms of complications - no redness or edema noted, only slight tenderness at site.  Patient states pain is manageable at time of discharge. Patient has an appointment with MD in 3 weeks 

## 2021-09-28 ENCOUNTER — Emergency Department (HOSPITAL_COMMUNITY): Payer: Commercial Managed Care - PPO

## 2021-09-28 ENCOUNTER — Other Ambulatory Visit: Payer: Self-pay

## 2021-09-28 ENCOUNTER — Encounter (HOSPITAL_COMMUNITY): Payer: Self-pay | Admitting: Emergency Medicine

## 2021-09-28 ENCOUNTER — Emergency Department (HOSPITAL_COMMUNITY)
Admission: EM | Admit: 2021-09-28 | Discharge: 2021-09-29 | Disposition: A | Discharge status: Home / Self Care | Payer: Commercial Managed Care - PPO | Attending: Emergency Medicine | Admitting: Emergency Medicine

## 2021-09-28 DIAGNOSIS — R1031 Right lower quadrant pain: Secondary | ICD-10-CM | POA: Diagnosis present

## 2021-09-28 DIAGNOSIS — N201 Calculus of ureter: Secondary | ICD-10-CM | POA: Diagnosis not present

## 2021-09-28 DIAGNOSIS — D72829 Elevated white blood cell count, unspecified: Secondary | ICD-10-CM | POA: Insufficient documentation

## 2021-09-28 LAB — COMPREHENSIVE METABOLIC PANEL
ALT: 26 U/L (ref 0–44)
AST: 21 U/L (ref 15–41)
Albumin: 3.6 g/dL (ref 3.5–5.0)
Alkaline Phosphatase: 101 U/L (ref 38–126)
Anion gap: 13 (ref 5–15)
BUN: 14 mg/dL (ref 6–20)
CO2: 25 mmol/L (ref 22–32)
Calcium: 9.6 mg/dL (ref 8.9–10.3)
Chloride: 101 mmol/L (ref 98–111)
Creatinine, Ser: 1.18 mg/dL (ref 0.61–1.24)
GFR, Estimated: >60 mL/min (ref >60)
Glucose, Bld: 157 mg/dL — ABNORMAL HIGH (ref 70–99)
Potassium: 3.7 mmol/L (ref 3.5–5.1)
Sodium: 139 mmol/L (ref 135–145)
Total Bilirubin: 0.5 mg/dL (ref 0.3–1.2)
Total Protein: 7.2 g/dL (ref 6.5–8.1)

## 2021-09-28 LAB — URINALYSIS, ROUTINE W REFLEX MICROSCOPIC
Bilirubin Urine: NEGATIVE
Glucose, UA: NEGATIVE mg/dL
Ketones, ur: NEGATIVE mg/dL
Leukocytes,Ua: NEGATIVE
Nitrite: NEGATIVE
Protein, ur: NEGATIVE mg/dL
RBC / HPF: >50 RBC/hpf — ABNORMAL HIGH (ref 0–5)
Specific Gravity, Urine: 1.017 (ref 1.005–1.030)
pH: 6 (ref 5.0–8.0)

## 2021-09-28 LAB — CBC WITH DIFFERENTIAL/PLATELET
Abs Immature Granulocytes: 0.1 10*3/uL — ABNORMAL HIGH (ref 0.00–0.07)
Basophils Absolute: 0.1 10*3/uL (ref 0.0–0.1)
Basophils Relative: 0 %
Eosinophils Absolute: 0.1 10*3/uL (ref 0.0–0.5)
Eosinophils Relative: 0 %
HCT: 45.1 % (ref 39.0–52.0)
Hemoglobin: 14.6 g/dL (ref 13.0–17.0)
Immature Granulocytes: 1 %
Lymphocytes Relative: 9 %
Lymphs Abs: 1.2 10*3/uL (ref 0.7–4.0)
MCH: 29.7 pg (ref 26.0–34.0)
MCHC: 32.4 g/dL (ref 30.0–36.0)
MCV: 91.9 fL (ref 80.0–100.0)
Monocytes Absolute: 0.8 10*3/uL (ref 0.1–1.0)
Monocytes Relative: 6 %
Neutro Abs: 10.5 10*3/uL — ABNORMAL HIGH (ref 1.7–7.7)
Neutrophils Relative %: 84 %
Platelets: 590 10*3/uL — ABNORMAL HIGH (ref 150–400)
RBC: 4.91 MIL/uL (ref 4.22–5.81)
RDW: 13.5 % (ref 11.5–15.5)
WBC: 12.7 10*3/uL — ABNORMAL HIGH (ref 4.0–10.5)
nRBC: 0 % (ref 0.0–0.2)

## 2021-09-28 LAB — LIPASE, BLOOD: Lipase: 32 U/L (ref 11–51)

## 2021-09-28 MED ORDER — HYDROCODONE-ACETAMINOPHEN 5-325 MG PO TABS
1.0000 | ORAL_TABLET | Freq: Once | ORAL | Status: AC
  Administered 2021-09-28: 22:00:00 1 via ORAL
  Filled 2021-09-28: qty 1

## 2021-09-28 MED ORDER — HYDROCODONE-ACETAMINOPHEN 5-325 MG PO TABS
1.0000 | ORAL_TABLET | Freq: Once | ORAL | Status: AC
  Administered 2021-09-28: 16:00:00 1 via ORAL
  Filled 2021-09-28: qty 1

## 2021-09-28 NOTE — ED Provider Triage Note (Signed)
Emergency Medicine Provider Triage Evaluation Note  Ralph Mathews , a 46 y.o. male  was evaluated in triage.  Pt complains of right-sided flank pain that started abruptly at noon today. No rash. Denies injury. He also endorses difficulties urinating. No history of kidney stones.   Review of Systems  Positive: Flank pain, difficulty urinating Negative: fever  Physical Exam  BP 122/86 (BP Location: Right Arm)    Pulse 95    Temp 97.8 F (36.6 C) (Oral)    Resp 18    SpO2 100%  Gen:   Awake, no distress   Resp:  Normal effort  MSK:   Moves extremities without difficulty  Other:    Medical Decision Making  Medically screening exam initiated at 3:26 PM.  Appropriate orders placed.  Gildo Crisco was informed that the remainder of the evaluation will be completed by another provider, this initial triage assessment does not replace that evaluation, and the importance of remaining in the ED until their evaluation is complete.  Possible kidney stone Renal study Labs UA to rule out infection   Suzy Bouchard, [place] 09/28/21 1527

## 2021-09-28 NOTE — ED Triage Notes (Signed)
Pt presents to ED POV. Pt c/o L flank pain. Pt reports pain suddenly onset around 1200. Difficulty urinating since.

## 2021-09-29 MED ORDER — OXYCODONE-ACETAMINOPHEN 5-325 MG PO TABS
1.0000 | ORAL_TABLET | Freq: Three times a day (TID) | ORAL | 0 refills | Status: AC | PRN
Start: 2021-09-29 — End: 2021-10-02

## 2021-09-29 MED ORDER — OXYCODONE-ACETAMINOPHEN 5-325 MG PO TABS
1.0000 | ORAL_TABLET | Freq: Once | ORAL | Status: AC
  Administered 2021-09-29: 04:00:00 1 via ORAL
  Filled 2021-09-29: qty 1

## 2021-09-29 MED ORDER — KETOROLAC TROMETHAMINE 60 MG/2ML IM SOLN
30.0000 mg | Freq: Once | INTRAMUSCULAR | Status: AC
Start: 2021-09-29 — End: 2021-09-29
  Administered 2021-09-29: 06:00:00 30 mg via INTRAMUSCULAR
  Filled 2021-09-29: qty 2

## 2021-09-29 MED ORDER — ONDANSETRON HCL 4 MG PO TABS
4.0000 mg | ORAL_TABLET | Freq: Four times a day (QID) | ORAL | 0 refills | Status: DC
Start: 2021-09-29 — End: 2021-11-12

## 2021-09-29 MED ORDER — TAMSULOSIN HCL 0.4 MG PO CAPS: 0.4000 mg | ORAL_CAPSULE | Freq: Every day | ORAL | 0 refills | Status: AC

## 2021-09-29 MED ORDER — HYDROMORPHONE HCL 1 MG/ML IJ SOLN
1.0000 mg | Freq: Once | INTRAMUSCULAR | Status: AC
  Administered 2021-09-29: 06:00:00 1 mg via INTRAMUSCULAR
  Filled 2021-09-29: qty 1

## 2021-09-29 NOTE — Discharge Instructions (Signed)
You have a 4 mm stone in your right ureter should pass into your bladder soon.  I have given you Flomax this will help dilate your ureter to help pass your stone please beware this can drop your blood pressure if you become lightheaded or dizziness please discontinue the medication.  Also given you Zofran please use any for nausea.  Please stay hydrated, you may also use ibuprofen as well as help with your pain.  I have given you a short course of narcotics please take as prescribed.  This medication can make you drowsy do not consume alcohol or operate heavy machinery when taking this medication.  This medication is Tylenol in it do not take Tylenol and take this medication.    Please follow-up with urology for further evaluation  If your symptoms worsen please come back for reevaluation especially if you develop fevers, chills, uncontrolled nausea vomiting severe flank pain unable able to urinate as you will need further evaluation.

## 2021-09-29 NOTE — ED Provider Notes (Signed)
[facility] EMERGENCY DEPARTMENT Provider Note   CSN: [id number] Arrival date & time: 09/28/21  1513     History  Chief Complaint  Patient presents with   Flank Pain    Ralph Mathews is a 46 y.o. male.  HPI  Patient with medical history including recent laminectomy presents to the emergency department with complaints of right side pain, started yesterday, came on suddenly, states the pain is a sharp and stabbing-like sensation will wax and wane in intensity, feel pain rating from his right flank into his groin, has some pain with urination, denies decreased urination testicular pain penile discharge has associated nausea without vomiting no fevers, chills, congestion general body aches.  He has no significant c abdominal history, no history of kidney stones, he denies any alleviating or aggravating factors.  Home Medications Prior to Admission medications   Medication Sig Start Date End Date Taking? Authorizing Provider  buPROPion (WELLBUTRIN XL) 150 MG 24 hr tablet Take 1 tablet (150 mg total) by mouth daily. 05/17/21  Yes Sowles, Drue Stager, MD  citalopram (CELEXA) 40 MG tablet TAKE 1 TABLET BY MOUTH EVERY DAY Patient taking differently: Take 40 mg by mouth daily. 05/09/21  Yes Sowles, Drue Stager, MD  ibuprofen (ADVIL) 200 MG tablet Take 800 mg by mouth every 6 (six) hours as needed for headache or moderate pain.   Yes [provider]  montelukast (SINGULAIR) 10 MG tablet TAKE 1 TABLET BY MOUTH EVERYDAY AT BEDTIME Patient taking differently: Take 10 mg by mouth at bedtime as needed (spring and fall allergies). 05/17/21  Yes Sowles, Drue Stager, MD  olopatadine (PATANOL) 0.1 % ophthalmic solution Place 1 drop into both eyes 2 (two) times daily. Patient taking differently: Place 1 drop into both eyes 2 (two) times daily as needed for allergies. 11/11/20  Yes Sowles, Drue Stager, MD  ondansetron (ZOFRAN) 4 MG tablet Take 1 tablet (4 mg total) by mouth every 6 (six) hours.  09/29/21  Yes Marcello Fennel, PA-C  oxyCODONE-acetaminophen (PERCOCET/ROXICET) 5-325 MG tablet Take 1 tablet by mouth every 8 (eight) hours as needed for up to 3 days for severe pain. 09/29/21 10/02/21 Yes Marcello Fennel, PA-C  Rimegepant Sulfate (NURTEC) 75 MG TBDP Take 1 tablet by mouth daily as needed. Patient taking differently: Take 75 mg by mouth daily as needed (migraine). 05/18/21  Yes Sowles, Drue Stager, MD  SUMAtriptan (IMITREX) 100 MG tablet Take 1 tablet (100 mg total) by mouth every 2 (two) hours as needed for migraine. May repeat in 2 hours if headache persists or recurs. 05/17/21  Yes Sowles, Drue Stager, MD  tamsulosin (FLOMAX) 0.4 MG CAPS capsule Take 1 capsule (0.4 mg total) by mouth daily for 7 days. 09/29/21 10/06/21 Yes Marcello Fennel, PA-C  traZODone (DESYREL) 100 MG tablet Take 1 tablet (100 mg total) by mouth every evening. 05/17/21  Yes Sowles, Drue Stager, MD  cyclobenzaprine (FLEXERIL) 10 MG tablet Take 1 tablet (10 mg total) by mouth 3 (three) times daily as needed for muscle spasms. Patient not taking: Reported on 09/29/2021 09/15/21   Newman Pies, MD  docusate sodium (COLACE) 100 MG capsule Take 1 capsule (100 mg total) by mouth 2 (two) times daily. Patient not taking: Reported on 09/29/2021 09/15/21   Newman Pies, MD      Allergies    Patient has no known allergies.    Review of Systems   Review of Systems  Constitutional:  Negative for chills and fever.  Respiratory:  Negative for shortness of breath.  Cardiovascular:  Negative for chest pain.  Gastrointestinal:  Negative for abdominal pain.  Genitourinary:  Positive for dysuria and flank pain. Negative for decreased urine volume.  Neurological:  Negative for headaches.   Physical Exam Updated Vital Signs BP 140/88    Pulse 97    Temp 98.5 F (36.9 C)    Resp 19    SpO2 100%  Physical Exam Vitals and nursing note reviewed.  Constitutional:      General: Ralph Mathews is not in acute distress.     Appearance: Ralph Mathews is not ill-appearing.  HENT:     Head: Normocephalic and atraumatic.     Nose: No congestion.  Eyes:     Conjunctiva/sclera: Conjunctivae normal.  Cardiovascular:     Rate and Rhythm: Normal rate and regular rhythm.     Pulses: Normal pulses.     Heart sounds: No murmur heard.   No friction rub. No gallop.  Pulmonary:     Effort: No respiratory distress.     Breath sounds: No wheezing, rhonchi or rales.  Abdominal:     Palpations: Abdomen is soft.     Tenderness: There is no abdominal tenderness. There is right CVA tenderness. There is no left CVA tenderness.     Comments: Abdomen nondistended, has slight tenderness along his right side, there is no guarding, rebound times, peritoneal sign negative Murphy's or McBurney point, positive right-sided CVA tenderness.  Musculoskeletal:     Right lower leg: No edema.     Left lower leg: No edema.  Skin:    General: Skin is warm and dry.  Neurological:     Mental Status: Ralph Mathews is alert.  Psychiatric:        Mood and Affect: Mood normal.    ED Results / Procedures / Treatments   Labs (all labs ordered are listed, but only abnormal results are displayed) Labs Reviewed  CBC WITH DIFFERENTIAL/PLATELET - Abnormal; Notable for the following components:      Result Value   WBC 12.7 (*)    Platelets 590 (*)    Neutro Abs 10.5 (*)    Abs Immature Granulocytes 0.10 (*)    All other components within normal limits  COMPREHENSIVE METABOLIC PANEL - Abnormal; Notable for the following components:   Glucose, Bld 157 (*)    All other components within normal limits  URINALYSIS, ROUTINE W REFLEX MICROSCOPIC - Abnormal; Notable for the following components:   APPearance HAZY (*)    Hgb urine dipstick LARGE (*)    RBC / HPF >50 (*)    Bacteria, UA FEW (*)    All other components within normal limits  LIPASE, BLOOD    EKG None  Radiology CT Renal Stone Study  Result Date: 09/28/2021 CLINICAL DATA:   Flank pain, kidney stone suspected Recent lumbar surgery EXAM: CT ABDOMEN AND PELVIS WITHOUT CONTRAST TECHNIQUE: Multidetector CT imaging of the abdomen and pelvis was performed following the standard protocol without IV contrast. RADIATION DOSE REDUCTION: This exam was performed according to the departmental dose-optimization program which includes automated exposure control, adjustment of the mA and/or kV according to patient size and/or use of iterative reconstruction technique. COMPARISON:  None. FINDINGS: Lower chest: The lung bases are clear. Hepatobiliary: No focal liver abnormality is seen. No gallstones, gallbladder wall thickening, or biliary dilatation. Pancreas: No ductal dilatation or inflammation. Spleen: Normal in size without focal abnormality. Adrenals/Urinary Tract: No adrenal nodule. Obstructing 4 mm stone at the right ureterovesicular junction with mild to  moderate proximal hydroureteronephrosis. Mild right perinephric edema. No additional right renal calculi. There is a punctate nonobstructing stone in the lower left kidney. No left hydronephrosis. Urinary bladder is near completely empty. Stomach/Bowel: Detailed bowel assessment is limited in the absence of contrast and paucity of intra-abdominal fat. Stomach is physiologically distended with ingested contents. There is no small bowel obstruction or inflammation. Appendix is upper normal in caliber at 6 mm, however no appendicolith or periappendiceal fat stranding. Small volume of colonic stool. Vascular/Lymphatic: Normal caliber abdominal aorta. Portal venous or mesenteric gas. No bulky abdominopelvic adenopathy. Multiple phleboliths in the pelvis. Reproductive: Prostate is unremarkable. Other: No free air, free fluid/ascites, or focal fluid collection. No abdominal wall hernia. Musculoskeletal: Posterior lumbar fusion L4-L5 with interbody spacer. IMPRESSION: 1. Obstructing 4 mm stone at the right ureterovesicular junction with mild to  moderate proximal hydroureteronephrosis. 2. Punctate nonobstructing stone in the lower left kidney. Electronically Signed   By: Keith Rake M.D.   On: 09/28/2021 16:00    Procedures Procedures    Medications Ordered in ED Medications  HYDROcodone-acetaminophen (NORCO/VICODIN) 5-325 MG per tablet 1 tablet (1 tablet Oral Given 09/28/21 1536)  HYDROcodone-acetaminophen (NORCO/VICODIN) 5-325 MG per tablet 1 tablet (1 tablet Oral Given 09/28/21 2152)  oxyCODONE-acetaminophen (PERCOCET/ROXICET) 5-325 MG per tablet 1 tablet (1 tablet Oral Given 09/29/21 0347)  ketorolac (TORADOL) injection 30 mg (30 mg Intramuscular Given 09/29/21 0614)  HYDROmorphone (DILAUDID) injection 1 mg (1 mg Intramuscular Given 09/29/21 1610)    ED Course/ Medical Decision Making/ A&P                           Medical Decision Making Risk Prescription drug management.   This patient presents to the ED for concern of right-sided flank pain, this involves an extensive number of treatment options, and is a complaint that carries with it a high risk of complications and morbidity.  The differential diagnosis includes kidney stone, Pilo, dissection,    Additional history obtained:  Additional history obtained from electronic medical record    Co morbidities that complicate the patient evaluation  N/A  Social Determinants of Health:  N/A    Lab Tests:  I Ordered, and personally interpreted labs.  The pertinent results include: CBC shows her toes of 12.7, CMP is unremarkable UA shows red blood cells white blood cells few bacteria lipase is 32   Imaging Studies ordered:  I ordered imaging studies including CT renal I independently visualized and interpreted imaging which showed shows 4 mm stone at the UVJ point with mild hydronephrosis I agree with the radiologist interpretation   Medicines ordered and prescription drug management:  I ordered medication including Dilaudid, Toradol for right-sided  flank pain I have reviewed the patients home medicines and have made adjustments as needed    Reevaluation:  After the interventions noted above, I reevaluated the patient and found that they have :improved  Rule out Low suspicion for systemic infection patient is nontoxic-appearing vital signs reassuring does not meet sepsis or SIRS criteria.  Does note to have a slight increase in his white count I suspect this is likely acute phase reactant secondary to the 4 mm stone.  I have low suspicion for UTI or septic stone as UA is negative for signs of infection.  Low suspicion for pancreatitis as lipase within normal limits.  Low suspicion for liver or gallbladder normality as he has no right upper quadrant tenderness liver enzymes alk phos T bili  all within normal limits.  Low suspicion for bowel obstruction as CT imaging is negative for these findings.  I have low suspicion for AAA presentation atrial etiology patient has low risk factors more consistent with kidney stone.    Dispostion and problem list  After consideration of the diagnostic results and the patients response to treatment, I feel that the patent would benefit from   Right flank pain-likely secondary  4 mm stone at the UVJ point likely this will pass on its own, will provide him with Flomax, pain medication, antiemetics follow-up with urology for further evaluation given strict return precautions.             Final Clinical Impression(s) / ED Diagnoses Final diagnoses:  Ureterolithiasis    Rx / DC Orders ED Discharge Orders          Ordered    tamsulosin (FLOMAX) 0.4 MG CAPS capsule  Daily        09/29/21 0644    ondansetron (ZOFRAN) 4 MG tablet  Every 6 hours        09/29/21 0644    oxyCODONE-acetaminophen (PERCOCET/ROXICET) 5-325 MG tablet  Every 8 hours PRN        09/29/21 0644              Marcello Fennel, PA-C 09/29/21 2952    Ripley Fraise, MD 09/29/21 [phone]

## 2021-10-11 ENCOUNTER — Encounter: Payer: Self-pay | Admitting: Family Medicine

## 2021-10-18 ENCOUNTER — Encounter: Payer: Self-pay | Admitting: Family Medicine

## 2021-11-11 NOTE — Progress Notes (Signed)
Name: Ralph Mathews   MRN: [id number]    DOB: [birth year]   Date:11/12/2021       Progress Note  Subjective  Chief Complaint  Annual Exam  HPI  Patient presents for annual CPE and follow up  History of back surgery: he had laminectomy back  2015 by Dr. Hal Neer, he had some recurrence of radiculitis on right side starting 08/2016 symptoms got progressively worse and had a lumbar fusion L4-L5 by Dr. Arnoldo Morale 09/13/2021, he is doing well now, pain is very  mild no radiculitis and back to work . Off medications    Depression: taking Citalopram for many years and also  added Wellbutrin June 2020.  No side effects of medication. 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 -he has been looking forward to that. He has a Equities trader in [employer] , but phq 9 has been controlled   History of kidney stones: went to [facility] back in January and passed the stone on the right. No pain now    Migraine Headaches: he has episodes of left temporal pain , it starts quickly and is very intense.  Pain is  described as starting as a throbbing sensation and sometimes radiates to frontal area, at times it is  severe, has phonophobia and photophobia, occasionally associated with nausea but no vomiting lately Symptoms started many years ago .His episodes happens every couple of weeks but can last a few days. Discussed importance of taking medication right away    Insomnia: he has been taking Trazodone and is able to fall and stay asleep most nights, he states sleeping about 5-6 hours per night and most of the time wakes up feeling rested. He has episodes of vivid dreams. He states no longer having violent dreams in over one year. Wakes up kicking or punching. We referred him to neurologist but he lost to follow up  Perennial AR: continue medication   IPSS Questionnaire (AUA-7): Over the past month   1)  How often have you had a sensation of not emptying your bladder completely after you  finish urinating?  0 - Not at all  2)  How often have you had to urinate again less than two hours after you finished urinating? 0 - Not at all  3)  How often have you found you stopped and started again several times when you urinated?  1 - Less than 1 time in 5  4) How difficult have you found it to postpone urination?  0 - Not at all  5) How often have you had a weak urinary stream?  0 - Not at all  6) How often have you had to push or strain to begin urination?  0 - Not at all  7) How many times did you most typically get up to urinate from the time you went to bed until the time you got up in the morning?  0 - None  Total score:  0-7 mildly symptomatic   8-19 moderately symptomatic   20-35 severely symptomatic     Diet: balanced , discussed cutting down on alcohol intake  Exercise: he just had back surgery and will resume physical activity   Depression: phq 9 is negative Depression screen [facility] 2/9 11/12/2021 05/17/2021 11/11/2020 02/26/2020 08/19/2019  Decreased Interest 0 0 0 0 0  Down, Depressed, Hopeless 0 0 1 0 0  PHQ - 2 Score 0 0 1 0 0  Altered sleeping 0 1 1  1 1  Tired, decreased energy 0 0 '1 1 1  '$ Change in appetite 0 0 0 0 0  Feeling bad or failure about yourself  0 0 0 0 1  Trouble concentrating 0 0 0 0 0  Moving slowly or fidgety/restless 0 0 0 0 0  Suicidal thoughts 0 0 0 0 0  PHQ-9 Score 0 '1 3 2 3  '$ Difficult doing work/chores Not difficult at all Not difficult at all - Not difficult at all Not difficult at all  Some recent data might be hidden    Hypertension:  BP Readings from Last 3 Encounters:  11/12/21 120/72  09/29/21 140/88  09/15/21 116/78    Obesity: Wt Readings from Last 3 Encounters:  11/12/21 153 lb 9.6 oz (69.7 kg)  09/09/21 152 lb 1.6 oz (69 kg)  05/17/21 162 lb (73.5 kg)   BMI Readings from Last 3 Encounters:  11/12/21 22.04 kg/m  09/09/21 21.82 kg/m  05/17/21 23.24 kg/m     Lipids:  Lab Results  Component Value Date   CHOL 167  11/11/2020   CHOL 167 08/17/2018   CHOL 181 08/07/2017   Lab Results  Component Value Date   HDL 72 11/11/2020   HDL 63 08/17/2018   HDL 67 08/07/2017   Lab Results  Component Value Date   LDLCALC 80 11/11/2020   LDLCALC 87 08/17/2018   LDLCALC 94 08/07/2017   Lab Results  Component Value Date   TRIG 69 11/11/2020   TRIG 78 08/17/2018   TRIG 106 08/07/2017   Lab Results  Component Value Date   CHOLHDL 2.3 11/11/2020   CHOLHDL 2.7 08/17/2018   CHOLHDL 2.7 08/07/2017   No results found for: LDLDIRECT Glucose:  Glucose, Bld  Date Value Ref Range Status  09/28/2021 157 (H) 70 - 99 mg/dL Final    Comment:    Glucose reference range applies only to samples taken after fasting for at least 8 hours.  09/15/2021 127 (H) 70 - 99 mg/dL Final    Comment:    Glucose reference range applies only to samples taken after fasting for at least 8 hours.  09/14/2021 116 (H) 70 - 99 mg/dL Final    Comment:    Glucose reference range applies only to samples taken after fasting for at least 8 hours.    [place] Office Visit from 11/12/2021 in [facility]  AUDIT-C Score 0       Married STD testing and prevention (HIV/chl/gon/syphilis):  not applicable Hep C Screening: up to date  Skin cancer: Discussed monitoring for atypical lesions Colorectal cancer: family history of colon cancer, grandfather  Prostate cancer:  no Lab Results  Component Value Date   PSA 0.37 11/11/2020     Lung cancer:  Low Dose CT Chest recommended if Age 106-80 years, 61 pack-year currently smoking OR have quit w/in 15years. Patient  not applicable AAA: The USPSTF recommends one-time screening with ultrasonography in men ages 43 to 18 years who have ever smoked. Patient:  not applicable ECG:  N/A  Vaccines:   Tdap: up to date  Pneumonia: N/A Flu: up to date COVID-19:up to date   Advanced Care Planning: A voluntary discussion about advance care planning including the  explanation and discussion of advance directives.  Discussed health care proxy and Living will, and the patient was able to identify a health care proxy as wife .  Patient does not have a living will or power of attorney of health care  Patient Active Problem List   Diagnosis Date Noted   Spondylolisthesis of lumbar region 09/13/2021   Major depression in remission ([place]) 08/05/2016   History of back surgery 08/05/2016   Migraine without aura and without status migrainosus, not intractable 05/25/2015   Insomnia, persistent 04/11/2015   Chronic constipation 04/11/2015   Lumbosacral spondylosis without myelopathy 04/11/2015   Circadian rhythm sleep disorder, shift work type 04/11/2015   Ejaculates too soon 04/11/2015   Perennial allergic rhinitis with seasonal variation 04/11/2015    Past Surgical History:  Procedure Laterality Date   LAMINECTOMY  08/14/2014   NASAL SEPTOPLASTY W/ TURBINOPLASTY  09/05/2008   ORIF METATARSAL FRACTURE Right    SPINAL FUSION  09/13/2021    Family History  Problem Relation Age of Onset   Depression Mother    Hypertension Mother    Obesity Mother    Anxiety disorder Father     Social History   Socioeconomic History   Marital status: Married    Spouse name: Estill Bamberg   Number of children: 4   Years of education: 17   Highest education level: Bachelor's degree (e.g., BA, AB, BS)  Occupational History   Occupation: cook   Tobacco Use   Smoking status: Never   Smokeless tobacco: Never  Vaping Use   Vaping Use: Never used  Substance and Sexual Activity   Alcohol use: Yes    Alcohol/week: 6.0 standard drinks    Types: 4 Cans of beer, 2 Standard drinks or equivalent per week    Comment: 2 mixed drinks and 4 beers a week    Drug use: No    Comment: experimented with marijuana as a teenager   Sexual activity: Yes    Partners: Female    Birth control/protection: Other-see comments    Comment: wife had an ablation  Other Topics Concern   Not on  file  Social History Narrative   Married, has 4 daughters, mother-in-law helps them out ( taking care of children and financially )    Social Determinants of Health   Financial Resource Strain: Low Risk    Difficulty of Paying Living Expenses: Not hard at all  Food Insecurity: No Food Insecurity   Worried About Charity fundraiser in the Last Year: Never true   Arboriculturist in the Last Year: Never true  Transportation Needs: No Transportation Needs   Lack of Transportation (Medical): No   Lack of Transportation (Non-Medical): No  Physical Activity: Insufficiently Active   Days of Exercise per Week: 2 days   Minutes of Exercise per Session: 60 min  Stress: Stress Concern Present   Feeling of Stress : To some extent  Social Connections: Moderately Isolated   Frequency of Communication with Friends and Family: Three times a week   Frequency of Social Gatherings with Friends and Family: Three times a week   Attends Religious Services: Never   Active Member of Clubs or Organizations: No   Attends Archivist Meetings: Never   Marital Status: Married  Human resources officer Violence: Not At Risk   Fear of Current or Ex-Partner: No   Emotionally Abused: No   Physically Abused: No   Sexually Abused: No     Current Outpatient Medications:    montelukast (SINGULAIR) 10 MG tablet, TAKE 1 TABLET BY MOUTH EVERYDAY AT BEDTIME (Patient taking differently: Take 10 mg by mouth at bedtime as needed (spring and fall allergies).), Disp: 90 tablet, Rfl: 1   Rimegepant Sulfate (NURTEC) 75 MG TBDP,  Take 1 tablet by mouth daily as needed. (Patient taking differently: Take 75 mg by mouth daily as needed (migraine).), Disp: 16 tablet, Rfl: 2   SUMAtriptan (IMITREX) 100 MG tablet, Take 1 tablet (100 mg total) by mouth every 2 (two) hours as needed for migraine. May repeat in 2 hours if headache persists or recurs., Disp: 9 tablet, Rfl: 1   buPROPion (WELLBUTRIN XL) 150 MG 24 hr tablet, Take 1 tablet  (150 mg total) by mouth daily., Disp: 90 tablet, Rfl: 1   citalopram (CELEXA) 40 MG tablet, Take 1 tablet (40 mg total) by mouth daily., Disp: 90 tablet, Rfl: 1   olopatadine (PATANOL) 0.1 % ophthalmic solution, Place 1 drop into both eyes 2 (two) times daily., Disp: 5 mL, Rfl: 2   traZODone (DESYREL) 100 MG tablet, Take 1 tablet (100 mg total) by mouth every evening., Disp: 90 tablet, Rfl: 1  No Known Allergies   ROS  Constitutional: Negative for fever or weight change.  Respiratory: Negative for cough and shortness of breath.   Cardiovascular: Negative for chest pain or palpitations.  Gastrointestinal: Negative for abdominal pain, no bowel changes.  Musculoskeletal: Negative for gait problem or joint swelling.  Skin: Negative for rash.  Neurological: Negative for dizziness , positive for  headache.  No other specific complaints in a complete review of systems (except as listed in HPI above).    Objective  Vitals:   11/12/21 0812  BP: 120/72  Pulse: 100  Resp: 16  Temp: 97.7 F (36.5 C)  TempSrc: Oral  SpO2: 99%  Weight: 153 lb 9.6 oz (69.7 kg)  Height: '5\' 10"'$  (1.778 m)    Body mass index is 22.04 kg/m.  Physical Exam  Constitutional: Patient appears well-developed and well-nourished. No distress.  HENT: Head: Normocephalic and atraumatic. Ears: B TMs ok, no erythema or effusion; Nose: Not done  Mouth/Throat: not done  Eyes: Conjunctivae and EOM are normal. Pupils are equal, round, and reactive to light. No scleral icterus.  Neck: Normal range of motion. Neck supple. No JVD present. No thyromegaly present.  Cardiovascular: Normal rate, regular rhythm and normal heart sounds.  No murmur heard. No BLE edema. Pulmonary/Chest: Effort normal and breath sounds normal. No respiratory distress. Abdominal: Soft. Bowel sounds are normal, no distension. There is no tenderness. no masses MALE GENITALIA: Normal descended testes bilaterally, no masses palpated, no hernias, no  lesions, no discharge RECTAL: not done  Musculoskeletal: Normal range of motion, no joint effusions. No gross deformities Neurological: he is alert and oriented to person, place, and time. No cranial nerve deficit. Coordination, balance, strength, speech and gait are normal.  Skin: Skin is warm and dry. No rash noted. No erythema.  Psychiatric: Patient has a normal mood and affect. behavior is normal. Judgment and thought content normal.   Recent Results (from the past 2160 hour(s))  Surgical pcr screen     Status: Abnormal   Collection Time: 09/09/21  9:19 AM   Specimen: Nasal Mucosa; Nasal Swab  Result Value Ref Range   MRSA, PCR NEGATIVE NEGATIVE   Staphylococcus aureus POSITIVE (A) NEGATIVE    Comment: (NOTE) The Xpert SA Assay (FDA approved for NASAL specimens in patients 66 years of age and older), is one component of a comprehensive surveillance program. It is not intended to diagnose infection nor to guide or monitor treatment. Performed at [facility], [place] [address] [place], [place] [address]   SARS CORONAVIRUS 2 (TAT 6-24 HRS) Nasopharyngeal Nasopharyngeal Swab  Status: None   Collection Time: 09/09/21  9:19 AM   Specimen: Nasopharyngeal Swab  Result Value Ref Range   SARS Coronavirus 2 NEGATIVE NEGATIVE    Comment: (NOTE) SARS-CoV-2 target nucleic acids are NOT DETECTED.  The SARS-CoV-2 RNA is generally detectable in upper and lower respiratory specimens during the acute phase of infection. Negative results do not preclude SARS-CoV-2 infection, do not rule out co-infections with other pathogens, and should not be used as the sole basis for treatment or other patient management decisions. Negative results must be combined with clinical observations, patient history, and epidemiological information. The expected result is Negative.  Fact Sheet for Patients: SugarRoll.be  Fact Sheet for Healthcare  Providers: https://www.woods-mathews.com/  This test is not yet approved or cleared by the [place] FDA and  has been authorized for detection and/or diagnosis of SARS-CoV-2 by FDA under an Emergency Use Authorization (EUA). This EUA will remain  in effect (meaning this test can be used) for the duration of the COVID-19 declaration under Se ction 564(b)(1) of the Act, 21 U.S.C. section 360bbb-3(b)(1), unless the authorization is terminated or revoked sooner.  Performed at [facility], [place] [address], [place] [address]   CBC     Status: None   Collection Time: 09/09/21  9:20 AM  Result Value Ref Range   WBC 5.3 4.0 - 10.5 K/uL   RBC 5.10 4.22 - 5.81 MIL/uL   Hemoglobin 15.7 13.0 - 17.0 g/dL   HCT 47.9 39.0 - 52.0 %   MCV 93.9 80.0 - 100.0 fL   MCH 30.8 26.0 - 34.0 pg   MCHC 32.8 30.0 - 36.0 g/dL   RDW 13.6 11.5 - 15.5 %   Platelets 230 150 - 400 K/uL   nRBC 0.0 0.0 - 0.2 %    Comment: Performed at [facility], [place] [address], [place], [place] [address]  Comprehensive metabolic panel     Status: Abnormal   Collection Time: 09/09/21  9:20 AM  Result Value Ref Range   Sodium 142 135 - 145 mmol/L   Potassium 4.4 3.5 - 5.1 mmol/L   Chloride 108 98 - 111 mmol/L   CO2 27 22 - 32 mmol/L   Glucose, Bld 82 70 - 99 mg/dL    Comment: Glucose reference range applies only to samples taken after fasting for at least 8 hours.   BUN 17 6 - 20 mg/dL   Creatinine, Ser 0.89 0.61 - 1.24 mg/dL   Calcium 8.9 8.9 - 10.3 mg/dL   Total Protein 6.2 (L) 6.5 - 8.1 g/dL   Albumin 3.6 3.5 - 5.0 g/dL   AST 19 15 - 41 U/L   ALT 21 0 - 44 U/L   Alkaline Phosphatase 59 38 - 126 U/L   Total Bilirubin 0.7 0.3 - 1.2 mg/dL   GFR, Estimated >60 >60 mL/min    Comment: (NOTE) Calculated using the CKD-EPI Creatinine Equation (2021)    Anion gap 7 5 - 15    Comment: Performed at [facility] [address] [place], [place] [address]  Type and screen     Status:  None   Collection Time: 09/09/21  9:27 AM  Result Value Ref Range   ABO/RH(D) O POS    Antibody Screen NEG    Sample Expiration 09/23/2021,2359    Extend sample reason      NO TRANSFUSIONS OR PREGNANCY IN THE PAST 3 MONTHS Performed at [facility], [place]  [address], [place], [place] [address]   ABO/Rh     Status: None   Collection Time: 09/13/21  8:41 AM  Result Value Ref Range   ABO/RH(D)      O POS Performed at [facility] [address] [place], [place] [address]   CBC     Status: Abnormal   Collection Time: 09/14/21  6:03 AM  Result Value Ref Range   WBC 12.7 (H) 4.0 - 10.5 K/uL   RBC 4.26 4.22 - 5.81 MIL/uL   Hemoglobin 13.3 13.0 - 17.0 g/dL   HCT 39.8 39.0 - 52.0 %   MCV 93.4 80.0 - 100.0 fL   MCH 31.2 26.0 - 34.0 pg   MCHC 33.4 30.0 - 36.0 g/dL   RDW 13.7 11.5 - 15.5 %   Platelets 195 150 - 400 K/uL   nRBC 0.0 0.0 - 0.2 %    Comment: Performed at [facility], [place] [address], [place], [place] [address]  Basic Metabolic Panel     Status: Abnormal   Collection Time: 09/14/21  6:03 AM  Result Value Ref Range   Sodium 138 135 - 145 mmol/L   Potassium 4.2 3.5 - 5.1 mmol/L   Chloride 102 98 - 111 mmol/L   CO2 31 22 - 32 mmol/L   Glucose, Bld 116 (H) 70 - 99 mg/dL    Comment: Glucose reference range applies only to samples taken after fasting for at least 8 hours.   BUN 11 6 - 20 mg/dL   Creatinine, Ser 0.95 0.61 - 1.24 mg/dL   Calcium 8.1 (L) 8.9 - 10.3 mg/dL   GFR, Estimated >60 >60 mL/min    Comment: (NOTE) Calculated using the CKD-EPI Creatinine Equation (2021)    Anion gap 5 5 - 15    Comment: Performed at [facility] [address], [place], [place] [address]  CBC     Status: Abnormal   Collection Time: 09/15/21  2:48 AM  Result Value Ref Range   WBC 9.1 4.0 - 10.5 K/uL   RBC 3.94 (L) 4.22 - 5.81 MIL/uL   Hemoglobin 12.3 (L) 13.0 - 17.0 g/dL   HCT 37.3 (L) 39.0 - 52.0 %   MCV 94.7 80.0 - 100.0 fL   MCH 31.2 26.0 - 34.0 pg    MCHC 33.0 30.0 - 36.0 g/dL   RDW 13.9 11.5 - 15.5 %   Platelets 183 150 - 400 K/uL   nRBC 0.0 0.0 - 0.2 %    Comment: Performed at [facility], [place] [address] [place], [place] [address]  Basic metabolic panel     Status: Abnormal   Collection Time: 09/15/21  2:48 AM  Result Value Ref Range   Sodium 135 135 - 145 mmol/L   Potassium 3.7 3.5 - 5.1 mmol/L   Chloride 104 98 - 111 mmol/L   CO2 27 22 - 32 mmol/L   Glucose, Bld 127 (H) 70 - 99 mg/dL    Comment: Glucose reference range applies only to samples taken after fasting for at least 8 hours.   BUN 12 6 - 20 mg/dL   Creatinine, Ser 1.06 0.61 - 1.24 mg/dL   Calcium 7.7 (L) 8.9 - 10.3 mg/dL   GFR, Estimated >60 >60 mL/min    Comment: (NOTE) Calculated using the CKD-EPI Creatinine Equation (2021)    Anion gap 4 (L) 5 - 15    Comment: Performed at [facility] [address],  [place], [place] [address]  CBC with Differential     Status: Abnormal   Collection Time: 09/28/21  3:30 PM  Result Value Ref Range   WBC 12.7 (H) 4.0 - 10.5 K/uL   RBC 4.91 4.22 - 5.81 MIL/uL   Hemoglobin 14.6 13.0 - 17.0 g/dL   HCT 45.1 39.0 - 52.0 %   MCV 91.9 80.0 - 100.0 fL   MCH 29.7 26.0 - 34.0 pg   MCHC 32.4 30.0 - 36.0 g/dL   RDW 13.5 11.5 - 15.5 %   Platelets 590 (H) 150 - 400 K/uL   nRBC 0.0 0.0 - 0.2 %   Neutrophils Relative % 84 %   Neutro Abs 10.5 (H) 1.7 - 7.7 K/uL   Lymphocytes Relative 9 %   Lymphs Abs 1.2 0.7 - 4.0 K/uL   Monocytes Relative 6 %   Monocytes Absolute 0.8 0.1 - 1.0 K/uL   Eosinophils Relative 0 %   Eosinophils Absolute 0.1 0.0 - 0.5 K/uL   Basophils Relative 0 %   Basophils Absolute 0.1 0.0 - 0.1 K/uL   Immature Granulocytes 1 %   Abs Immature Granulocytes 0.10 (H) 0.00 - 0.07 K/uL    Comment: Performed at [facility] [address] [place], [place] [address]  Comprehensive metabolic panel     Status: Abnormal   Collection Time: 09/28/21  3:30 PM  Result Value Ref Range   Sodium 139 135 - 145  mmol/L   Potassium 3.7 3.5 - 5.1 mmol/L   Chloride 101 98 - 111 mmol/L   CO2 25 22 - 32 mmol/L   Glucose, Bld 157 (H) 70 - 99 mg/dL    Comment: Glucose reference range applies only to samples taken after fasting for at least 8 hours.   BUN 14 6 - 20 mg/dL   Creatinine, Ser 1.18 0.61 - 1.24 mg/dL   Calcium 9.6 8.9 - 10.3 mg/dL   Total Protein 7.2 6.5 - 8.1 g/dL   Albumin 3.6 3.5 - 5.0 g/dL   AST 21 15 - 41 U/L   ALT 26 0 - 44 U/L   Alkaline Phosphatase 101 38 - 126 U/L   Total Bilirubin 0.5 0.3 - 1.2 mg/dL   GFR, Estimated >60 >60 mL/min    Comment: (NOTE) Calculated using the CKD-EPI Creatinine Equation (2021)    Anion gap 13 5 - 15    Comment: Performed at [facility] [address], [place], [place] [address]  Lipase, blood     Status: None   Collection Time: 09/28/21  3:30 PM  Result Value Ref Range   Lipase 32 11 - 51 U/L    Comment: Performed at [facility] [address] [place], [place] [address]  Urinalysis, Routine w reflex microscopic Urine, Clean Catch     Status: Abnormal   Collection Time: 09/28/21  4:19 PM  Result Value Ref Range   Color, Urine YELLOW YELLOW   APPearance HAZY (A) CLEAR   Specific Gravity, Urine 1.017 1.005 - 1.030   pH 6.0 5.0 - 8.0   Glucose, UA NEGATIVE NEGATIVE mg/dL   Hgb urine dipstick LARGE (A) NEGATIVE   Bilirubin Urine NEGATIVE NEGATIVE   Ketones, ur NEGATIVE NEGATIVE mg/dL   Protein, ur NEGATIVE NEGATIVE mg/dL   Nitrite NEGATIVE NEGATIVE   Leukocytes,Ua NEGATIVE NEGATIVE   RBC / HPF >50 (H) 0 - 5 RBC/hpf   WBC, UA 6-10 0 - 5 WBC/hpf   Bacteria, UA FEW (A) NONE  SEEN   Mucus PRESENT     Comment: Performed at [facility], [place] [address] [place], [place] [address]     Fall Risk: Fall Risk  11/12/2021 05/17/2021 11/11/2020 02/26/2020 08/19/2019  Falls in the past year? 0 0 1 0 0  Number falls in past yr: 0 0 1 0 0  Comment - - 3 - -  Injury with Fall? 0 0 0 0 0  Risk for fall due to : No Fall Risks No Fall  Risks - - -  Follow up Falls prevention discussed Falls prevention discussed - - -     Functional Status Survey: Is the patient deaf or have difficulty hearing?: No Does the patient have difficulty seeing, even when wearing glasses/contacts?: No Does the patient have difficulty concentrating, remembering, or making decisions?: No Does the patient have difficulty walking or climbing stairs?: No Does the patient have difficulty dressing or bathing?: No Does the patient have difficulty doing errands alone such as visiting a doctor's office or shopping?: No    Assessment & Plan  1. Major depression, recurrent, chronic (HCC)  - buPROPion (WELLBUTRIN XL) 150 MG 24 hr tablet; Take 1 tablet (150 mg total) by mouth daily.  Dispense: 90 tablet; Refill: 1 - citalopram (CELEXA) 40 MG tablet; Take 1 tablet (40 mg total) by mouth daily.  Dispense: 90 tablet; Refill: 1  2. Well adult exam  - COMPLETE METABOLIC PANEL WITH GFR  3. Insomnia, persistent  - traZODone (DESYREL) 100 MG tablet; Take 1 tablet (100 mg total) by mouth every evening.  Dispense: 90 tablet; Refill: 1  4. Allergic conjunctivitis of both eyes  - olopatadine (PATANOL) 0.1 % ophthalmic solution; Place 1 drop into both eyes 2 (two) times daily.  Dispense: 5 mL; Refill: 2  5. Lipid screening  - Lipid panel  6. Colon cancer screening  - Ambulatory referral to Gastroenterology  7. Leukocytosis, unspecified type  - CBC with Differential/Platelet  8. Need for influenza vaccination   9. Family history of colon cancer   10. Migraine without aura and without status migrainosus, not intractable   11. Screening for diabetes mellitus  - Hemoglobin A1c  12. Other microscopic hematuria   13. History of kidney stones  - Urinalysis, Complete    -Prostate cancer screening and PSA options (with potential risks and benefits of testing vs not testing) were discussed along with recent recs/guidelines. -USPSTF grade A  and B recommendations reviewed with patient; age-appropriate recommendations, preventive care, screening tests, etc discussed and encouraged; healthy living encouraged; see AVS for patient education given to patient -Discussed importance of 150 minutes of physical activity weekly, eat two servings of fish weekly, eat one serving of tree nuts ( cashews, pistachios, pecans, almonds.Marland Kitchen) every other day, eat 6 servings of fruit/vegetables daily and drink plenty of water and avoid sweet beverages.  -Reviewed Health Maintenance: yes

## 2021-11-12 ENCOUNTER — Encounter: Payer: Self-pay | Admitting: Family Medicine

## 2021-11-12 ENCOUNTER — Other Ambulatory Visit: Payer: Self-pay

## 2021-11-12 ENCOUNTER — Ambulatory Visit (INDEPENDENT_AMBULATORY_CARE_PROVIDER_SITE_OTHER): Payer: Commercial Managed Care - PPO | Admitting: Family Medicine

## 2021-11-12 VITALS — BP 120/72 | HR 100 | Temp 97.7°F | Resp 16 | Ht 70.0 in | Wt 153.6 lb

## 2021-11-12 DIAGNOSIS — F339 Major depressive disorder, recurrent, unspecified: Secondary | ICD-10-CM

## 2021-11-12 DIAGNOSIS — Z23 Encounter for immunization: Secondary | ICD-10-CM | POA: Diagnosis not present

## 2021-11-12 DIAGNOSIS — Z13220 Encounter for screening for lipoid disorders: Secondary | ICD-10-CM | POA: Diagnosis not present

## 2021-11-12 DIAGNOSIS — Z131 Encounter for screening for diabetes mellitus: Secondary | ICD-10-CM

## 2021-11-12 DIAGNOSIS — Z1211 Encounter for screening for malignant neoplasm of colon: Secondary | ICD-10-CM

## 2021-11-12 DIAGNOSIS — G43009 Migraine without aura, not intractable, without status migrainosus: Secondary | ICD-10-CM

## 2021-11-12 DIAGNOSIS — G4700 Insomnia, unspecified: Secondary | ICD-10-CM

## 2021-11-12 DIAGNOSIS — D72829 Elevated white blood cell count, unspecified: Secondary | ICD-10-CM | POA: Diagnosis not present

## 2021-11-12 DIAGNOSIS — R3129 Other microscopic hematuria: Secondary | ICD-10-CM

## 2021-11-12 DIAGNOSIS — Z0000 Encounter for general adult medical examination without abnormal findings: Secondary | ICD-10-CM

## 2021-11-12 DIAGNOSIS — Z87442 Personal history of urinary calculi: Secondary | ICD-10-CM

## 2021-11-12 DIAGNOSIS — Z800 Family history of malignant neoplasm of digestive organs: Secondary | ICD-10-CM

## 2021-11-12 DIAGNOSIS — H1013 Acute atopic conjunctivitis, bilateral: Secondary | ICD-10-CM

## 2021-11-12 MED ORDER — NURTEC 75 MG PO TBDP: 1.0000 | ORAL_TABLET | ORAL | 2 refills | Status: DC

## 2021-11-12 MED ORDER — CITALOPRAM HYDROBROMIDE 40 MG PO TABS: 40.0000 mg | ORAL_TABLET | Freq: Every day | ORAL | 1 refills | Status: DC

## 2021-11-12 MED ORDER — TRAZODONE HCL 100 MG PO TABS: 100.0000 mg | ORAL_TABLET | Freq: Every evening | ORAL | 1 refills | Status: DC

## 2021-11-12 MED ORDER — NA SULFATE-K SULFATE-MG SULF 17.5-3.13-1.6 GM/177ML PO SOLN: 1.0000 | Freq: Once | ORAL | 0 refills | Status: AC

## 2021-11-12 MED ORDER — BUPROPION HCL ER (XL) 150 MG PO TB24: 150.0000 mg | ORAL_TABLET | Freq: Every day | ORAL | 1 refills | Status: DC

## 2021-11-12 MED ORDER — OLOPATADINE HCL 0.1 % OP SOLN: 1.0000 [drp] | Freq: Two times a day (BID) | OPHTHALMIC | 2 refills | Status: DC

## 2021-11-12 NOTE — Patient Instructions (Signed)
Mederna or Vitamin E oil for scarring  ?

## 2021-11-12 NOTE — Progress Notes (Signed)
Gastroenterology Pre-Procedure Review ? ?Request Date: 12/31/2021 ?Requesting Physician: Dr. Marius Ditch ? ?PATIENT REVIEW QUESTIONS: The patient responded to the following health history questions as indicated:   ? ?1. Are you having any GI issues? no ?2. Do you have a personal history of Polyps? no ?3. Do you have a family history of Colon Cancer or Polyps? yes (polyps) ?4. Diabetes Mellitus? no ?5. Joint replacements in the past 12 months?no ?6. Major health problems in the past 3 months?yes (kidney stone and back surgery 2 months ago ) ?7. Any artificial heart valves, MVP, or defibrillator?no ?   ?MEDICATIONS & ALLERGIES:    ?Patient reports the following regarding taking any anticoagulation/antiplatelet therapy:   ?Plavix, Coumadin, Eliquis, Xarelto, Lovenox, Pradaxa, Brilinta, or Effient? no ?Aspirin? no ? ?Patient confirms/reports the following medications:  ?Current Outpatient Medications  ?Medication Sig Dispense Refill  ? buPROPion (WELLBUTRIN XL) 150 MG 24 hr tablet Take 1 tablet (150 mg total) by mouth daily. 90 tablet 1  ? citalopram (CELEXA) 40 MG tablet Take 1 tablet (40 mg total) by mouth daily. 90 tablet 1  ? montelukast (SINGULAIR) 10 MG tablet TAKE 1 TABLET BY MOUTH EVERYDAY AT BEDTIME (Patient taking differently: Take 10 mg by mouth at bedtime as needed (spring and fall allergies).) 90 tablet 1  ? olopatadine (PATANOL) 0.1 % ophthalmic solution Place 1 drop into both eyes 2 (two) times daily. 5 mL 2  ? Rimegepant Sulfate (NURTEC) 75 MG TBDP Take 1 tablet by mouth every other day. 16 tablet 2  ? SUMAtriptan (IMITREX) 100 MG tablet Take 1 tablet (100 mg total) by mouth every 2 (two) hours as needed for migraine. May repeat in 2 hours if headache persists or recurs. 9 tablet 1  ? traZODone (DESYREL) 100 MG tablet Take 1 tablet (100 mg total) by mouth every evening. 90 tablet 1  ? ?No current facility-administered medications for this visit.  ? ? ?Patient confirms/reports the following allergies:  ?No  Known Allergies ? ?No orders of the defined types were placed in this encounter. ? ? ?AUTHORIZATION INFORMATION ?Primary Insurance: ?1D#: ?Group #: ? ?Secondary Insurance: ?1D#: ?Group #: ? ?SCHEDULE INFORMATION: ?Date: 12/31/2021 ?Time: ?Location:[facility] ? ?

## 2021-11-13 LAB — COMPLETE METABOLIC PANEL WITH GFR
AG Ratio: 1.6 (calc) (ref 1.0–2.5)
ALT: 13 U/L (ref 9–46)
AST: 15 U/L (ref 10–40)
Albumin: 4 g/dL (ref 3.6–5.1)
Alkaline phosphatase (APISO): 78 U/L (ref 36–130)
BUN: 17 mg/dL (ref 7–25)
CO2: 28 mmol/L (ref 20–32)
Calcium: 8.9 mg/dL (ref 8.6–10.3)
Chloride: 107 mmol/L (ref 98–110)
Creat: 0.87 mg/dL (ref 0.60–1.29)
Globulin: 2.5 g/dL (calc) (ref 1.9–3.7)
Glucose, Bld: 59 mg/dL — ABNORMAL LOW (ref 65–99)
Potassium: 4.2 mmol/L (ref 3.5–5.3)
Sodium: 142 mmol/L (ref 135–146)
Total Bilirubin: 0.5 mg/dL (ref 0.2–1.2)
Total Protein: 6.5 g/dL (ref 6.1–8.1)
eGFR: 108 mL/min/{1.73_m2} (ref >=60)

## 2021-11-13 LAB — URINALYSIS, COMPLETE
Bacteria, UA: NONE SEEN /HPF
Bilirubin Urine: NEGATIVE
Glucose, UA: NEGATIVE
Hgb urine dipstick: NEGATIVE
Ketones, ur: NEGATIVE
Leukocytes,Ua: NEGATIVE
Nitrite: NEGATIVE
Protein, ur: NEGATIVE
RBC / HPF: NONE SEEN /HPF (ref 0–2)
Specific Gravity, Urine: 1.03 (ref 1.001–1.035)
Squamous Epithelial / HPF: NONE SEEN /HPF (ref <=5)
WBC, UA: NONE SEEN /HPF (ref 0–5)
pH: 5.5 (ref 5.0–8.0)

## 2021-11-13 LAB — CBC WITH DIFFERENTIAL/PLATELET
Absolute Monocytes: 545 cells/uL (ref 200–950)
Basophils Absolute: 19 cells/uL (ref 0–200)
Basophils Relative: 0.4 %
Eosinophils Absolute: 122 cells/uL (ref 15–500)
Eosinophils Relative: 2.6 %
HCT: 47 % (ref 38.5–50.0)
Hemoglobin: 15.1 g/dL (ref 13.2–17.1)
Lymphs Abs: 1048 cells/uL (ref 850–3900)
MCH: 29.7 pg (ref 27.0–33.0)
MCHC: 32.1 g/dL (ref 32.0–36.0)
MCV: 92.5 fL (ref 80.0–100.0)
MPV: 9.5 fL (ref 7.5–12.5)
Monocytes Relative: 11.6 %
Neutro Abs: 2966 cells/uL (ref 1500–7800)
Neutrophils Relative %: 63.1 %
Platelets: 289 10*3/uL (ref 140–400)
RBC: 5.08 10*6/uL (ref 4.20–5.80)
RDW: 13.1 % (ref 11.0–15.0)
Total Lymphocyte: 22.3 %
WBC: 4.7 10*3/uL (ref 3.8–10.8)

## 2021-11-13 LAB — LIPID PANEL
Cholesterol: 140 mg/dL (ref <200)
HDL: 62 mg/dL (ref >=40)
LDL Cholesterol (Calc): 59 mg/dL (calc)
Non-HDL Cholesterol (Calc): 78 mg/dL (calc) (ref <130)
Total CHOL/HDL Ratio: 2.3 (calc) (ref <5.0)
Triglycerides: 106 mg/dL (ref <150)

## 2021-11-13 LAB — HEMOGLOBIN A1C
Hgb A1c MFr Bld: 5.2 % of total Hgb (ref <5.7)
Mean Plasma Glucose: 103 mg/dL
eAG (mmol/L): 5.7 mmol/L

## 2021-12-21 ENCOUNTER — Telehealth: Payer: Self-pay | Admitting: Gastroenterology

## 2021-12-21 NOTE — Telephone Encounter (Signed)
Patient called requesting to change the date of his colonoscopy. Requesting call back. ?

## 2021-12-22 ENCOUNTER — Telehealth: Payer: Self-pay

## 2021-12-22 NOTE — Telephone Encounter (Signed)
Called patient rescheduled for 02/07/2022 called endo and sent new referral and sent new communications  ?

## 2021-12-22 NOTE — Telephone Encounter (Signed)
Called about insurance is coming back in active  ?

## 2022-01-11 DIAGNOSIS — Z981 Arthrodesis status: Secondary | ICD-10-CM | POA: Diagnosis not present

## 2022-01-11 DIAGNOSIS — M4316 Spondylolisthesis, lumbar region: Secondary | ICD-10-CM | POA: Diagnosis not present

## 2022-01-25 ENCOUNTER — Telehealth: Payer: Self-pay

## 2022-01-25 NOTE — Telephone Encounter (Signed)
Received incoming call from patient requesting to cancel his colonoscopy.  He is unsure of when he will be able to reschedule-plans to call back to reschedule at a later date.  Thanks, [place], [place]

## 2022-02-07 ENCOUNTER — Encounter: Admission: RE | Payer: Self-pay | Source: Home / Self Care

## 2022-02-07 ENCOUNTER — Ambulatory Visit
Admission: RE | Admit: 2022-02-07 | Payer: Commercial Managed Care - PPO | Source: Home / Self Care | Admitting: Gastroenterology

## 2022-02-07 SURGERY — COLONOSCOPY WITH PROPOFOL
Anesthesia: General

## 2022-02-10 ENCOUNTER — Other Ambulatory Visit: Payer: Self-pay | Admitting: Family Medicine

## 2022-02-10 DIAGNOSIS — J302 Other seasonal allergic rhinitis: Secondary | ICD-10-CM

## 2022-04-02 ENCOUNTER — Other Ambulatory Visit: Payer: Self-pay | Admitting: Family Medicine

## 2022-04-02 DIAGNOSIS — G4700 Insomnia, unspecified: Secondary | ICD-10-CM

## 2022-05-20 ENCOUNTER — Ambulatory Visit: Payer: Commercial Managed Care - PPO | Admitting: Family Medicine

## 2022-06-03 NOTE — Progress Notes (Unsigned)
Name: Ralph Mathews   MRN: [id number]    DOB: 01-Oct-1975   Date:06/03/2022       Progress Note  Subjective  Chief Complaint  Follow Up  HPI  History of back surgery: he had laminectomy back  2015 by Dr. Hal Neer, he had some recurrence of radiculitis on right side starting 08/2016 symptoms got progressively worse and had a lumbar fusion L4-L5 by Dr. Arnoldo Morale 09/13/2021, he is doing well now, pain is very  mild no radiculitis and back to work . Off medications    Depression: taking Citalopram for many years and also  added Wellbutrin June 2020.  No side effects of medication. 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 -he has been looking forward to that. He has a Equities trader in [employer] , but phq 9 has been controlled   History of kidney stones: went to [facility] back in January and passed the stone on the right. No pain now    Migraine Headaches: he has episodes of left temporal pain , it starts quickly and is very intense.  Pain is  described as starting as a throbbing sensation and sometimes radiates to frontal area, at times it is  severe, has phonophobia and photophobia, occasionally associated with nausea but no vomiting lately Symptoms started many years ago .His episodes happens every couple of weeks but can last a few days. Discussed importance of taking medication right away    Insomnia: he has been taking Trazodone and is able to fall and stay asleep most nights, he states sleeping about 5-6 hours per night and most of the time wakes up feeling rested. He has episodes of vivid dreams. He states no longer having violent dreams in over one year. Wakes up kicking or punching. We referred him to neurologist but he lost to follow up  Perennial AR: continue medication   Patient Active Problem List   Diagnosis Date Noted   Spondylolisthesis of lumbar region 09/13/2021   Major depression in remission ([place]) 08/05/2016   History of back surgery 08/05/2016    Migraine without aura and without status migrainosus, not intractable 05/25/2015   Insomnia, persistent 04/11/2015   Chronic constipation 04/11/2015   Lumbosacral spondylosis without myelopathy 04/11/2015   Circadian rhythm sleep disorder, shift work type 04/11/2015   Ejaculates too soon 04/11/2015   Perennial allergic rhinitis with seasonal variation 04/11/2015    Past Surgical History:  Procedure Laterality Date   LAMINECTOMY  08/14/2014   NASAL SEPTOPLASTY W/ TURBINOPLASTY  09/05/2008   ORIF METATARSAL FRACTURE Right    SPINAL FUSION  09/13/2021    Family History  Problem Relation Age of Onset   Depression Mother    Hypertension Mother    Obesity Mother    Anxiety disorder Father     Social History   Tobacco Use   Smoking status: Never   Smokeless tobacco: Never  Substance Use Topics   Alcohol use: Yes    Alcohol/week: 6.0 standard drinks of alcohol    Types: 4 Cans of beer, 2 Standard drinks or equivalent per week    Comment: 2 mixed drinks and 4 beers a week      Current Outpatient Medications:    buPROPion (WELLBUTRIN XL) 150 MG 24 hr tablet, Take 1 tablet (150 mg total) by mouth daily., Disp: 90 tablet, Rfl: 1   citalopram (CELEXA) 40 MG tablet, Take 1 tablet (40 mg total) by mouth daily., Disp: 90 tablet, Rfl: 1  montelukast (SINGULAIR) 10 MG tablet, Take 1 tablet (10 mg total) by mouth at bedtime as needed (spring and fall allergies)., Disp: 90 tablet, Rfl: 0   olopatadine (PATANOL) 0.1 % ophthalmic solution, Place 1 drop into both eyes 2 (two) times daily., Disp: 5 mL, Rfl: 2   Rimegepant Sulfate (NURTEC) 75 MG TBDP, Take 1 tablet by mouth every other day., Disp: 16 tablet, Rfl: 2   SUMAtriptan (IMITREX) 100 MG tablet, Take 1 tablet (100 mg total) by mouth every 2 (two) hours as needed for migraine. May repeat in 2 hours if headache persists or recurs., Disp: 9 tablet, Rfl: 1   traZODone (DESYREL) 100 MG tablet, TAKE 1 TABLET BY MOUTH EVERY EVENING., Disp: 90  tablet, Rfl: 0  No Known Allergies  I personally reviewed active problem list, medication list, allergies, family history, social history, health maintenance with the patient/caregiver today.   ROS  ***  Objective  There were no vitals filed for this visit.  There is no height or weight on file to calculate BMI.  Physical Exam ***  No results found for this or any previous visit (from the past 2160 hour(s)).   PHQ2/9:    11/12/2021    8:02 AM 05/17/2021    8:22 AM 11/11/2020    9:18 AM 02/26/2020    2:34 PM 08/19/2019    9:17 AM  Depression screen PHQ 2/9  Decreased Interest 0 0 0 0 0  Down, Depressed, Hopeless 0 0 1 0 0  PHQ - 2 Score 0 0 1 0 0  Altered sleeping 0 '1 1 1 1  '$ Tired, decreased energy 0 0 '1 1 1  '$ Change in appetite 0 0 0 0 0  Feeling bad or failure about yourself  0 0 0 0 1  Trouble concentrating 0 0 0 0 0  Moving slowly or fidgety/restless 0 0 0 0 0  Suicidal thoughts 0 0 0 0 0  PHQ-9 Score 0 '1 3 2 3  '$ Difficult doing work/chores Not difficult at all Not difficult at all  Not difficult at all Not difficult at all    phq 9 is {gen pos DHR:416384}   Fall Risk:    11/12/2021    8:02 AM 05/17/2021    8:22 AM 11/11/2020    9:17 AM 02/26/2020    2:34 PM 08/19/2019    9:14 AM  Fall Risk   Falls in the past year? 0 0 1 0 0  Number falls in past yr: 0 0 1 0 0  Comment   3    Injury with Fall? 0 0 0 0 0  Risk for fall due to : No Fall Risks No Fall Risks     Follow up Falls prevention discussed Falls prevention discussed         Functional Status Survey:      Assessment & Plan  *** There are no diagnoses linked to this encounter.

## 2022-06-06 ENCOUNTER — Ambulatory Visit: Payer: Commercial Managed Care - PPO | Admitting: Family Medicine

## 2022-06-06 ENCOUNTER — Encounter: Payer: Self-pay | Admitting: Family Medicine

## 2022-06-06 VITALS — BP 118/68 | HR 88 | Resp 16 | Ht 70.0 in | Wt 160.0 lb

## 2022-06-06 DIAGNOSIS — Z23 Encounter for immunization: Secondary | ICD-10-CM

## 2022-06-06 DIAGNOSIS — J302 Other seasonal allergic rhinitis: Secondary | ICD-10-CM

## 2022-06-06 DIAGNOSIS — G4700 Insomnia, unspecified: Secondary | ICD-10-CM | POA: Diagnosis not present

## 2022-06-06 DIAGNOSIS — J3089 Other allergic rhinitis: Secondary | ICD-10-CM

## 2022-06-06 DIAGNOSIS — F339 Major depressive disorder, recurrent, unspecified: Secondary | ICD-10-CM | POA: Diagnosis not present

## 2022-06-06 DIAGNOSIS — G43009 Migraine without aura, not intractable, without status migrainosus: Secondary | ICD-10-CM

## 2022-06-06 MED ORDER — MONTELUKAST SODIUM 10 MG PO TABS: 10.0000 mg | ORAL_TABLET | Freq: Every evening | ORAL | 1 refills | Status: DC | PRN

## 2022-06-06 MED ORDER — CITALOPRAM HYDROBROMIDE 40 MG PO TABS: 40.0000 mg | ORAL_TABLET | Freq: Every day | ORAL | 1 refills | Status: DC

## 2022-06-06 MED ORDER — TRAZODONE HCL 100 MG PO TABS: 100.0000 mg | ORAL_TABLET | Freq: Every evening | ORAL | 0 refills | Status: DC

## 2022-06-06 MED ORDER — SUMATRIPTAN SUCCINATE 100 MG PO TABS: 100.0000 mg | ORAL_TABLET | ORAL | 1 refills | Status: DC | PRN

## 2022-06-06 MED ORDER — NURTEC 75 MG PO TBDP: 1.0000 | ORAL_TABLET | ORAL | 2 refills | Status: DC

## 2022-06-06 MED ORDER — BUPROPION HCL ER (XL) 150 MG PO TB24: 150.0000 mg | ORAL_TABLET | Freq: Every day | ORAL | 1 refills | Status: DC

## 2022-06-06 NOTE — Patient Instructions (Signed)
[phone] [facility] 

## 2022-11-18 NOTE — Progress Notes (Deleted)
Name: Ralph Mathews   MRN: [id number]    DOB: November 15, 1975   Date:11/18/2022       Progress Note  Subjective  Chief Complaint  Annual Exam  HPI  Patient presents for annual CPE.  IPSS Questionnaire (AUA-7): Over the past month.   1)  How often have you had a sensation of not emptying your bladder completely after you finish urinating?  {Rating:19227}  2)  How often have you had to urinate again less than two hours after you finished urinating? {Rating:19227}  3)  How often have you found you stopped and started again several times when you urinated?  {Rating:19227}  4) How difficult have you found it to postpone urination?  {Rating:19227}  5) How often have you had a weak urinary stream?  {Rating:19227}  6) How often have you had to push or strain to begin urination?  {Rating:19227}  7) How many times did you most typically get up to urinate from the time you went to bed until the time you got up in the morning?  {Rating:19228}  Total score:  0-7 mildly symptomatic   8-19 moderately symptomatic   20-35 severely symptomatic     Diet: *** Exercise: *** Last Dental Exam: **** Last Eye Exam: ***  Depression: phq 9 is {gen pos neg:315643}    06/06/2022    9:07 AM 11/12/2021    8:02 AM 05/17/2021    8:22 AM 11/11/2020    9:18 AM 02/26/2020    2:34 PM  Depression screen PHQ 2/9  Decreased Interest 0 0 0 0 0  Down, Depressed, Hopeless 1 0 0 1 0  PHQ - 2 Score 1 0 0 1 0  Altered sleeping 0 0 1 1 1   Tired, decreased energy 3 0 0 1 1  Change in appetite 0 0 0 0 0  Feeling bad or failure about yourself  0 0 0 0 0  Trouble concentrating 0 0 0 0 0  Moving slowly or fidgety/restless 0 0 0 0 0  Suicidal thoughts 0 0 0 0 0  PHQ-9 Score 4 0 1 3 2   Difficult doing work/chores  Not difficult at all Not difficult at all  Not difficult at all    Hypertension:  BP Readings from Last 3 Encounters:  06/06/22 118/68  11/12/21 120/72  09/29/21 140/88    Obesity: Wt Readings from Last 3  Encounters:  06/06/22 160 lb (72.6 kg)  11/12/21 153 lb 9.6 oz (69.7 kg)  09/09/21 152 lb 1.6 oz (69 kg)   BMI Readings from Last 3 Encounters:  06/06/22 22.96 kg/m  11/12/21 22.04 kg/m  09/09/21 21.82 kg/m     Lipids:  Lab Results  Component Value Date   CHOL 140 11/12/2021   CHOL 167 11/11/2020   CHOL 167 08/17/2018   Lab Results  Component Value Date   HDL 62 11/12/2021   HDL 72 11/11/2020   HDL 63 08/17/2018   Lab Results  Component Value Date   LDLCALC 59 11/12/2021   LDLCALC 80 11/11/2020   LDLCALC 87 08/17/2018   Lab Results  Component Value Date   TRIG 106 11/12/2021   TRIG 69 11/11/2020   TRIG 78 08/17/2018   Lab Results  Component Value Date   CHOLHDL 2.3 11/12/2021   CHOLHDL 2.3 11/11/2020   CHOLHDL 2.7 08/17/2018   No results found for: "LDLDIRECT" Glucose:  Glucose, Bld  Date Value Ref Range Status  11/12/2021 59 (L) 65 - 99 mg/dL Final  Comment:    .            Fasting reference interval .   09/28/2021 157 (H) 70 - 99 mg/dL Final    Comment:    Glucose reference range applies only to samples taken after fasting for at least 8 hours.  09/15/2021 127 (H) 70 - 99 mg/dL Final    Comment:    Glucose reference range applies only to samples taken after fasting for at least 8 hours.    [place] Office Visit from 11/12/2021 in [facility]  AUDIT-C Score 0       Married STD testing and prevention (HIV/chl/gon/syphilis): N/A Sexual history:  Hep C Screening: 08/17/18 Skin cancer: Discussed monitoring for atypical lesions Colorectal cancer: N/A Prostate cancer:   Lab Results  Component Value Date   PSA 0.37 11/11/2020     Lung cancer:  Low Dose CT Chest recommended if Age 63-80 years, 30 pack-year currently smoking OR have quit w/in 15years. Patient  no a candidate for screening   AAA: The USPSTF recommends one-time screening with ultrasonography in men ages 25 to 55 years who have ever smoked.  Patient   no, a candidate for screening  ECG:  03/06/17  Vaccines:   HPV: N/A Tdap: up to date Shingrix: N/A Pneumonia: N/A Flu: up to date COVID-19: up to date  Advanced Care Planning: A voluntary discussion about advance care planning including the explanation and discussion of advance directives.  Discussed health care proxy and Living will, and the patient was able to identify a health care proxy as ***.  Patient does not have a living will and power of attorney of health care   Patient Active Problem List   Diagnosis Date Noted   Spondylolisthesis of lumbar region 09/13/2021   Major depression in remission ([place]) 08/05/2016   History of back surgery 08/05/2016   Migraine without aura and without status migrainosus, not intractable 05/25/2015   Insomnia, persistent 04/11/2015   Chronic constipation 04/11/2015   Lumbosacral spondylosis without myelopathy 04/11/2015   Circadian rhythm sleep disorder, shift work type 04/11/2015   Ejaculates too soon 04/11/2015   Perennial allergic rhinitis with seasonal variation 04/11/2015    Past Surgical History:  Procedure Laterality Date   LAMINECTOMY  08/14/2014   NASAL SEPTOPLASTY W/ TURBINOPLASTY  09/05/2008   ORIF METATARSAL FRACTURE Right    SPINAL FUSION  09/13/2021    Family History  Problem Relation Age of Onset   Depression Mother    Hypertension Mother    Obesity Mother    Anxiety disorder Father     Social History   Socioeconomic History   Marital status: Married    Spouse name: Estill Bamberg   Number of children: 4   Years of education: 17   Highest education level: Bachelor's degree (e.g., BA, AB, BS)  Occupational History   Occupation: cook   Tobacco Use   Smoking status: Never   Smokeless tobacco: Never  Vaping Use   Vaping Use: Never used  Substance and Sexual Activity   Alcohol use: Yes    Alcohol/week: 6.0 standard drinks of alcohol    Types: 4 Cans of beer, 2 Standard drinks or equivalent per week     Comment: 2 mixed drinks and 4 beers a week    Drug use: No    Comment: experimented with marijuana as a teenager   Sexual activity: Yes    Partners: Female    Birth control/protection: Other-see comments  Comment: wife had an ablation  Other Topics Concern   Not on file  Social History Narrative   Married, has 4 daughters, mother-in-law helps them out ( taking care of children and financially )    Social Determinants of Health   Financial Resource Strain: Low Risk  (11/12/2021)   Overall Financial Resource Strain (CARDIA)    Difficulty of Paying Living Expenses: Not hard at all  Food Insecurity: No Food Insecurity (11/12/2021)   Hunger Vital Sign    Worried About Running Out of Food in the Last Year: Never true    [place] in the Last Year: Never true  Transportation Needs: No Transportation Needs (11/12/2021)   PRAPARE - Hydrologist (Medical): No    Lack of Transportation (Non-Medical): No  Physical Activity: Insufficiently Active (11/12/2021)   Exercise Vital Sign    Days of Exercise per Week: 2 days    Minutes of Exercise per Session: 60 min  Stress: Stress Concern Present (11/12/2021)   [place]    Feeling of Stress : To some extent  Social Connections: Moderately Isolated (11/12/2021)   Social Connection and Isolation Panel [NHANES]    Frequency of Communication with Friends and Family: Three times a week    Frequency of Social Gatherings with Friends and Family: Three times a week    Attends Religious Services: Never    Active Member of Clubs or Organizations: No    Attends Archivist Meetings: Never    Marital Status: Married  Human resources officer Violence: Not At Risk (11/12/2021)   Humiliation, Afraid, Rape, and Kick questionnaire    Fear of Current or Ex-Partner: No    Emotionally Abused: No    Physically Abused: No    Sexually Abused: No     Current  Outpatient Medications:    buPROPion (WELLBUTRIN XL) 150 MG 24 hr tablet, Take 1 tablet (150 mg total) by mouth daily., Disp: 90 tablet, Rfl: 1   citalopram (CELEXA) 40 MG tablet, Take 1 tablet (40 mg total) by mouth daily., Disp: 90 tablet, Rfl: 1   montelukast (SINGULAIR) 10 MG tablet, Take 1 tablet (10 mg total) by mouth at bedtime as needed (spring and fall allergies)., Disp: 90 tablet, Rfl: 1   olopatadine (PATANOL) 0.1 % ophthalmic solution, Place 1 drop into both eyes 2 (two) times daily., Disp: 5 mL, Rfl: 2   Rimegepant Sulfate (NURTEC) 75 MG TBDP, Take 1 tablet by mouth every other day., Disp: 16 tablet, Rfl: 2   SUMAtriptan (IMITREX) 100 MG tablet, Take 1 tablet (100 mg total) by mouth every 2 (two) hours as needed for migraine. May repeat in 2 hours if headache persists or recurs., Disp: 9 tablet, Rfl: 1   traZODone (DESYREL) 100 MG tablet, Take 1 tablet (100 mg total) by mouth every evening., Disp: 90 tablet, Rfl: 0  No Known Allergies   ROS  ***   Objective  There were no vitals filed for this visit.  There is no height or weight on file to calculate BMI.  Physical Exam ***  No results found for this or any previous visit (from the past 2160 hour(s)).   Fall Risk:    06/06/2022    9:07 AM 11/12/2021    8:02 AM 05/17/2021    8:22 AM 11/11/2020    9:17 AM 02/26/2020    2:34 PM  Fall Risk   Falls in the past year?  1 0 0 1 0  Number falls in past yr: 1 0 0 1 0  Comment    3   Injury with Fall? 0 0 0 0 0  Risk for fall due to : No Fall Risks No Fall Risks No Fall Risks    Follow up Falls prevention discussed Falls prevention discussed Falls prevention discussed       Functional Status Survey:      Assessment & Plan  1. Well adult exam ***    -Prostate cancer screening and PSA options (with potential risks and benefits of testing vs not testing) were discussed along with recent recs/guidelines. -USPSTF grade A and B recommendations reviewed with patient;  age-appropriate recommendations, preventive care, screening tests, etc discussed and encouraged; healthy living encouraged; see AVS for patient education given to patient -Discussed importance of 150 minutes of physical activity weekly, eat two servings of fish weekly, eat one serving of tree nuts ( cashews, pistachios, pecans, almonds.Marland Kitchen) every other day, eat 6 servings of fruit/vegetables daily and drink plenty of water and avoid sweet beverages.  -Reviewed Health Maintenance: yes

## 2022-11-21 ENCOUNTER — Encounter: Payer: Commercial Managed Care - PPO | Admitting: Family Medicine

## 2022-11-21 DIAGNOSIS — Z0000 Encounter for general adult medical examination without abnormal findings: Secondary | ICD-10-CM

## 2022-12-02 NOTE — Progress Notes (Signed)
Name: Ralph Mathews   MRN: [id number]    DOB: [birth year]   Date:12/05/2022       Progress Note  Subjective  Chief Complaint  Follow Up  HPI  History of back surgery: he had laminectomy back  2015 by Dr. Hal Neer, he had some recurrence of radiculitis on right side starting 08/2016 symptoms got progressively worse and had a lumbar fusion L4-L5 by Dr. Arnoldo Morale 09/13/2021, he is doing well now, he states pain is now sporadic, he is under the care of chiropractor now for upper back and neck. States no longer radiculitis    Depression: taking Citalopram for many years and also  added Wellbutrin June 2020.  No side effects of medication. 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 He states currently feeling stuck at work. He states he wants to change his job and possibly change his career   History of kidney stones: went to [facility] back in January 2023 and passed the stone on the right. No episodes since. He has been released from Urologist    Migraine Headaches: he has episodes of left temporal pain , it starts quickly and is very intense.  Pain is  described as starting as a throbbing sensation and sometimes radiates to frontal area, has phonophobia and photophobia, occasionally associated with nausea but no vomiting in a long time. Respond to Imitrex and Nurtec, sometimes no episodes in a month but other months more often. Advised to take nurteq every other day if symptoms gets to be more than 2 times per week  Night sweats : symptoms started after back surgery January 2023 , he states goes to bed cold and has one or two blankets and wakes up sweaty.    Insomnia: he has been taking Trazodone and is able to fall and stay asleep most nights, he goes to bed at 10 pm and has to wake up at 5 am.  He  states episodes of vivid dreams are seldom now .  We referred him to neurologist but he lost to follow up. He states they also have a larger bed   Perennial AR:  continue medication . Takes singulair and eye drops, prn antihistamine   Patient Active Problem List   Diagnosis Date Noted   Spondylolisthesis of lumbar region 09/13/2021   Major depression in remission 08/05/2016   History of back surgery 08/05/2016   Migraine without aura and without status migrainosus, not intractable 05/25/2015   Insomnia, persistent 04/11/2015   Chronic constipation 04/11/2015   Lumbosacral spondylosis without myelopathy 04/11/2015   Circadian rhythm sleep disorder, shift work type 04/11/2015   Ejaculates too soon 04/11/2015   Perennial allergic rhinitis with seasonal variation 04/11/2015    Past Surgical History:  Procedure Laterality Date   LAMINECTOMY  08/14/2014   NASAL SEPTOPLASTY W/ TURBINOPLASTY  09/05/2008   ORIF METATARSAL FRACTURE Right    SPINAL FUSION  09/13/2021    Family History  Problem Relation Age of Onset   Depression Mother    Hypertension Mother    Obesity Mother    Anxiety disorder Father     Social History   Tobacco Use   Smoking status: Never   Smokeless tobacco: Never  Substance Use Topics   Alcohol use: Yes    Alcohol/week: 6.0 standard drinks of alcohol    Types: 4 Cans of beer, 2 Standard drinks or equivalent per week    Comment: 2 mixed drinks and 4  beers a week      Current Outpatient Medications:    buPROPion (WELLBUTRIN XL) 150 MG 24 hr tablet, Take 1 tablet (150 mg total) by mouth daily., Disp: 90 tablet, Rfl: 1   citalopram (CELEXA) 40 MG tablet, Take 1 tablet (40 mg total) by mouth daily., Disp: 90 tablet, Rfl: 1   montelukast (SINGULAIR) 10 MG tablet, Take 1 tablet (10 mg total) by mouth at bedtime as needed (spring and fall allergies)., Disp: 90 tablet, Rfl: 1   olopatadine (PATANOL) 0.1 % ophthalmic solution, Place 1 drop into both eyes 2 (two) times daily., Disp: 5 mL, Rfl: 2   Rimegepant Sulfate (NURTEC) 75 MG TBDP, Take 1 tablet by mouth every other day., Disp: 16 tablet, Rfl: 2   SUMAtriptan (IMITREX)  100 MG tablet, Take 1 tablet (100 mg total) by mouth every 2 (two) hours as needed for migraine. May repeat in 2 hours if headache persists or recurs., Disp: 9 tablet, Rfl: 1   traZODone (DESYREL) 100 MG tablet, Take 1 tablet (100 mg total) by mouth every evening., Disp: 90 tablet, Rfl: 0  No Known Allergies  I personally reviewed active problem list, medication list, allergies, family history, social history, health maintenance with the patient/caregiver today.   ROS  Constitutional: Negative for fever or weight change.  Respiratory: Negative for cough and shortness of breath.   Cardiovascular: Negative for chest pain or palpitations.  Gastrointestinal: Negative for abdominal pain, no bowel changes.  Musculoskeletal: Negative for gait problem or joint swelling.  Skin: Negative for rash.  Neurological: Negative for dizziness or headache.  No other specific complaints in a complete review of systems (except as listed in HPI above).   Objective  Vitals:   12/05/22 0823  BP: 118/70  Pulse: 78  Resp: 16  SpO2: 99%  Weight: 158 lb (71.7 kg)  Height: 5\' 10"  (1.778 m)    Body mass index is 22.67 kg/m.  Physical Exam  Constitutional: Patient appears well-developed and well-nourished. No distress.  HEENT: head atraumatic, normocephalic, pupils equal and reactive to light, neck supple Cardiovascular: Normal rate, regular rhythm and normal heart sounds.  No murmur heard. No BLE edema. Pulmonary/Chest: Effort normal and breath sounds normal. No respiratory distress. Abdominal: Soft.  There is no tenderness. Psychiatric: Patient has a normal mood and affect. behavior is normal. Judgment and thought content normal.    PHQ2/9:    12/05/2022    8:22 AM 06/06/2022    9:07 AM 11/12/2021    8:02 AM 05/17/2021    8:22 AM 11/11/2020    9:18 AM  Depression screen PHQ 2/9  Decreased Interest 1 0 0 0 0  Down, Depressed, Hopeless 1 1 0 0 1  PHQ - 2 Score 2 1 0 0 1  Altered sleeping 3 0 0 1 1   Tired, decreased energy 3 3 0 0 1  Change in appetite 0 0 0 0 0  Feeling bad or failure about yourself  0 0 0 0 0  Trouble concentrating 0 0 0 0 0  Moving slowly or fidgety/restless 0 0 0 0 0  Suicidal thoughts 0 0 0 0 0  PHQ-9 Score 8 4 0 1 3  Difficult doing work/chores   Not difficult at all Not difficult at all     phq 9 is positive   Fall Risk:    12/05/2022    8:22 AM 06/06/2022    9:07 AM 11/12/2021    8:02 AM 05/17/2021  8:22 AM 11/11/2020    9:17 AM  Fall Risk   Falls in the past year? 1 1 0 0 1  Number falls in past yr: 1 1 0 0 1  Comment     3  Injury with Fall? 0 0 0 0 0  Risk for fall due to : No Fall Risks No Fall Risks No Fall Risks No Fall Risks   Follow up Falls prevention discussed Falls prevention discussed Falls prevention discussed Falls prevention discussed       Functional Status Survey: Is the patient deaf or have difficulty hearing?: No Does the patient have difficulty seeing, even when wearing glasses/contacts?: No Does the patient have difficulty concentrating, remembering, or making decisions?: No Does the patient have difficulty walking or climbing stairs?: No Does the patient have difficulty dressing or bathing?: No Does the patient have difficulty doing errands alone such as visiting a doctor's office or shopping?: No    Assessment & Plan  1. Migraine without aura and without status migrainosus, not intractable  - SUMAtriptan (IMITREX) 100 MG tablet; Take 1 tablet (100 mg total) by mouth every 2 (two) hours as needed for migraine. May repeat in 2 hours if headache persists or recurs.  Dispense: 9 tablet; Refill: 1 - Rimegepant Sulfate (NURTEC) 75 MG TBDP; Take 1 tablet (75 mg total) by mouth every other day.  Dispense: 16 tablet; Refill: 2  2. Major depression, recurrent, chronic  - citalopram (CELEXA) 40 MG tablet; Take 1 tablet (40 mg total) by mouth daily.  Dispense: 90 tablet; Refill: 1 - buPROPion (WELLBUTRIN XL) 150 MG 24 hr tablet;  Take 1 tablet (150 mg total) by mouth daily.  Dispense: 90 tablet; Refill: 1  3. Insomnia, persistent  - traZODone (DESYREL) 100 MG tablet; Take 1 tablet (100 mg total) by mouth every evening.  Dispense: 90 tablet; Refill: 1  4. Perennial allergic rhinitis with seasonal variation  - montelukast (SINGULAIR) 10 MG tablet; Take 1 tablet (10 mg total) by mouth at bedtime as needed (spring and fall allergies).  Dispense: 90 tablet; Refill: 1  5. Allergic conjunctivitis of both eyes  - olopatadine (PATANOL) 0.1 % ophthalmic solution; Place 1 drop into both eyes 2 (two) times daily.  Dispense: 5 mL; Refill: 2  6. Family history of colon cancer  - Ambulatory referral to Gastroenterology  7. Colon cancer screening  - Ambulatory referral to Gastroenterology

## 2022-12-05 ENCOUNTER — Ambulatory Visit: Payer: Commercial Managed Care - PPO | Admitting: Family Medicine

## 2022-12-05 ENCOUNTER — Encounter: Payer: Self-pay | Admitting: Family Medicine

## 2022-12-05 VITALS — BP 118/70 | HR 78 | Resp 16 | Ht 70.0 in | Wt 158.0 lb

## 2022-12-05 DIAGNOSIS — G4700 Insomnia, unspecified: Secondary | ICD-10-CM

## 2022-12-05 DIAGNOSIS — Z1211 Encounter for screening for malignant neoplasm of colon: Secondary | ICD-10-CM

## 2022-12-05 DIAGNOSIS — F339 Major depressive disorder, recurrent, unspecified: Secondary | ICD-10-CM | POA: Diagnosis not present

## 2022-12-05 DIAGNOSIS — Z800 Family history of malignant neoplasm of digestive organs: Secondary | ICD-10-CM

## 2022-12-05 DIAGNOSIS — H1013 Acute atopic conjunctivitis, bilateral: Secondary | ICD-10-CM

## 2022-12-05 DIAGNOSIS — J3089 Other allergic rhinitis: Secondary | ICD-10-CM | POA: Diagnosis not present

## 2022-12-05 DIAGNOSIS — G43009 Migraine without aura, not intractable, without status migrainosus: Secondary | ICD-10-CM | POA: Diagnosis not present

## 2022-12-05 DIAGNOSIS — J302 Other seasonal allergic rhinitis: Secondary | ICD-10-CM

## 2022-12-05 MED ORDER — BUPROPION HCL ER (XL) 150 MG PO TB24: 150.0000 mg | ORAL_TABLET | Freq: Every day | ORAL | 1 refills | Status: DC

## 2022-12-05 MED ORDER — NURTEC 75 MG PO TBDP: 1.0000 | ORAL_TABLET | ORAL | 2 refills | Status: DC

## 2022-12-05 MED ORDER — OLOPATADINE HCL 0.1 % OP SOLN: 1.0000 [drp] | Freq: Two times a day (BID) | OPHTHALMIC | 2 refills | Status: DC

## 2022-12-05 MED ORDER — TRAZODONE HCL 100 MG PO TABS: 100.0000 mg | ORAL_TABLET | Freq: Every evening | ORAL | 1 refills | Status: DC

## 2022-12-05 MED ORDER — MONTELUKAST SODIUM 10 MG PO TABS: 10.0000 mg | ORAL_TABLET | Freq: Every evening | ORAL | 1 refills | Status: DC | PRN

## 2022-12-05 MED ORDER — CITALOPRAM HYDROBROMIDE 40 MG PO TABS: 40.0000 mg | ORAL_TABLET | Freq: Every day | ORAL | 1 refills | Status: DC

## 2022-12-05 MED ORDER — SUMATRIPTAN SUCCINATE 100 MG PO TABS
100.0000 mg | ORAL_TABLET | ORAL | 1 refills | Status: DC | PRN
Start: 2022-12-05 — End: 2023-06-12

## 2022-12-06 ENCOUNTER — Telehealth: Payer: Self-pay | Admitting: *Deleted

## 2022-12-06 ENCOUNTER — Other Ambulatory Visit: Payer: Self-pay | Admitting: *Deleted

## 2022-12-06 DIAGNOSIS — Z83719 Family history of colon polyps, unspecified: Secondary | ICD-10-CM

## 2022-12-06 DIAGNOSIS — Z1211 Encounter for screening for malignant neoplasm of colon: Secondary | ICD-10-CM

## 2022-12-06 MED ORDER — NA SULFATE-K SULFATE-MG SULF 17.5-3.13-1.6 GM/177ML PO SOLN: 1.0000 | Freq: Once | ORAL | 0 refills | Status: AC

## 2022-12-06 NOTE — Telephone Encounter (Signed)
Gastroenterology Pre-Procedure Review  Request Date: 01/16/2023 Requesting Physician: Dr. Marius Ditch  PATIENT REVIEW QUESTIONS: The patient responded to the following health history questions as indicated:    1. Are you having any GI issues? no 2. Do you have a personal history of Polyps? no 3. Do you have a family history of Colon Cancer or Polyps? yes (family with polyps) 4. Diabetes Mellitus? no 5. Joint replacements in the past 12 months?no 6. Major health problems in the past 3 months?no 7. Any artificial heart valves, MVP, or defibrillator?no    MEDICATIONS & ALLERGIES:    Patient reports the following regarding taking any anticoagulation/antiplatelet therapy:   Plavix, Coumadin, Eliquis, Xarelto, Lovenox, Pradaxa, Brilinta, or Effient? no Aspirin? no  Patient confirms/reports the following medications:  Current Outpatient Medications  Medication Sig Dispense Refill   Na Sulfate-K Sulfate-Mg Sulf 17.5-3.13-1.6 GM/177ML SOLN Take 1 kit by mouth once for 1 dose. 354 mL 0   buPROPion (WELLBUTRIN XL) 150 MG 24 hr tablet Take 1 tablet (150 mg total) by mouth daily. 90 tablet 1   citalopram (CELEXA) 40 MG tablet Take 1 tablet (40 mg total) by mouth daily. 90 tablet 1   montelukast (SINGULAIR) 10 MG tablet Take 1 tablet (10 mg total) by mouth at bedtime as needed (spring and fall allergies). 90 tablet 1   olopatadine (PATANOL) 0.1 % ophthalmic solution Place 1 drop into both eyes 2 (two) times daily. 5 mL 2   Rimegepant Sulfate (NURTEC) 75 MG TBDP Take 1 tablet (75 mg total) by mouth every other day. 16 tablet 2   SUMAtriptan (IMITREX) 100 MG tablet Take 1 tablet (100 mg total) by mouth every 2 (two) hours as needed for migraine. May repeat in 2 hours if headache persists or recurs. 9 tablet 1   traZODone (DESYREL) 100 MG tablet Take 1 tablet (100 mg total) by mouth every evening. 90 tablet 1   No current facility-administered medications for this visit.    Patient confirms/reports the  following allergies:  No Known Allergies  No orders of the defined types were placed in this encounter.   AUTHORIZATION INFORMATION Primary Insurance: 1D#: Group #:  Secondary Insurance: 1D#: Group #:  SCHEDULE INFORMATION: Date: 01/16/2023 Time: Location: [place]

## 2023-01-16 ENCOUNTER — Encounter
Admission: RE | Disposition: A | Discharge status: Home / Self Care | Payer: Self-pay | Source: Home / Self Care | Attending: Gastroenterology

## 2023-01-16 ENCOUNTER — Ambulatory Visit: Payer: Commercial Managed Care - PPO | Admitting: Anesthesiology

## 2023-01-16 ENCOUNTER — Ambulatory Visit
Admission: RE | Admit: 2023-01-16 | Discharge: 2023-01-16 | Disposition: A | Discharge status: Home / Self Care | Payer: Commercial Managed Care - PPO | Attending: Gastroenterology | Admitting: Gastroenterology

## 2023-01-16 DIAGNOSIS — R519 Headache, unspecified: Secondary | ICD-10-CM | POA: Diagnosis not present

## 2023-01-16 DIAGNOSIS — Z1211 Encounter for screening for malignant neoplasm of colon: Secondary | ICD-10-CM

## 2023-01-16 DIAGNOSIS — F32A Depression, unspecified: Secondary | ICD-10-CM | POA: Diagnosis not present

## 2023-01-16 DIAGNOSIS — Z83719 Family history of colon polyps, unspecified: Secondary | ICD-10-CM

## 2023-01-16 HISTORY — PX: COLONOSCOPY WITH PROPOFOL: SHX5780

## 2023-01-16 SURGERY — COLONOSCOPY WITH PROPOFOL
Anesthesia: General

## 2023-01-16 MED ORDER — PROPOFOL 10 MG/ML IV BOLUS
INTRAVENOUS | Status: DC | PRN
  Administered 2023-01-16: 100 mg via INTRAVENOUS

## 2023-01-16 MED ORDER — PROPOFOL 500 MG/50ML IV EMUL
INTRAVENOUS | Status: DC | PRN
  Administered 2023-01-16: 175 ug/kg/min via INTRAVENOUS

## 2023-01-16 MED ORDER — PROPOFOL 1000 MG/100ML IV EMUL
INTRAVENOUS | Status: AC
  Filled 2023-01-16: qty 100

## 2023-01-16 MED ORDER — STERILE WATER FOR IRRIGATION IR SOLN
Status: DC | PRN
  Administered 2023-01-16 (×2): 60 mL

## 2023-01-16 MED ORDER — LIDOCAINE HCL (PF) 2 % IJ SOLN
INTRAMUSCULAR | Status: AC
  Filled 2023-01-16: qty 5

## 2023-01-16 MED ORDER — LIDOCAINE HCL (CARDIAC) PF 100 MG/5ML IV SOSY
PREFILLED_SYRINGE | INTRAVENOUS | Status: DC | PRN
  Administered 2023-01-16: 40 mg via INTRAVENOUS

## 2023-01-16 MED ORDER — SODIUM CHLORIDE 0.9 % IV SOLN
INTRAVENOUS | Status: DC
  Administered 2023-01-16: 20 mL/h via INTRAVENOUS

## 2023-01-16 NOTE — H&P (Signed)
Arlyss Repress, MD [address]  [address]  [place], [place] [address]  Main: [phone]  Fax: [phone] Pager: [phone]  Primary Care Physician:  Alba Cory, MD Primary Gastroenterologist:  Dr. Arlyss Repress  Pre-Procedure History & Physical: HPI:  Ralph Mathews is a 47 y.o. male is here for an colonoscopy.   Past Medical History:  Diagnosis Date   Allergy    Depression    DJD (degenerative joint disease)    GERD (gastroesophageal reflux disease)    Insomnia    Premature ejaculation    Rosacea     Past Surgical History:  Procedure Laterality Date   LAMINECTOMY  08/14/2014   NASAL SEPTOPLASTY W/ TURBINOPLASTY  09/05/2008   ORIF METATARSAL FRACTURE Right    SPINAL FUSION  09/13/2021    Prior to Admission medications   Medication Sig Start Date End Date Taking? Authorizing Provider  buPROPion (WELLBUTRIN XL) 150 MG 24 hr tablet Take 1 tablet (150 mg total) by mouth daily. 12/05/22  Yes Sowles, Danna Hefty, MD  citalopram (CELEXA) 40 MG tablet Take 1 tablet (40 mg total) by mouth daily. 12/05/22  Yes Sowles, Danna Hefty, MD  montelukast (SINGULAIR) 10 MG tablet Take 1 tablet (10 mg total) by mouth at bedtime as needed (spring and fall allergies). 12/05/22  Yes Sowles, Danna Hefty, MD  olopatadine (PATANOL) 0.1 % ophthalmic solution Place 1 drop into both eyes 2 (two) times daily. 12/05/22  Yes Sowles, Danna Hefty, MD  Rimegepant Sulfate (NURTEC) 75 MG TBDP Take 1 tablet (75 mg total) by mouth every other day. 12/05/22  Yes Sowles, Danna Hefty, MD  SUMAtriptan (IMITREX) 100 MG tablet Take 1 tablet (100 mg total) by mouth every 2 (two) hours as needed for migraine. May repeat in 2 hours if headache persists or recurs. 12/05/22  Yes Sowles, Danna Hefty, MD  traZODone (DESYREL) 100 MG tablet Take 1 tablet (100 mg total) by mouth every evening. 12/05/22  Yes Alba Cory, MD    Allergies as of 12/06/2022   (No Known Allergies)    Family History  Problem Relation Age of Onset   Depression  Mother    Hypertension Mother    Obesity Mother    Anxiety disorder Father     Social History   Socioeconomic History   Marital status: Married    Spouse name: Marchelle Folks   Number of children: 4   Years of education: 17   Highest education level: Bachelor's degree (e.g., BA, AB, BS)  Occupational History   Occupation: cook   Tobacco Use   Smoking status: Never   Smokeless tobacco: Never  Vaping Use   Vaping Use: Never used  Substance and Sexual Activity   Alcohol use: Yes    Alcohol/week: 6.0 standard drinks of alcohol    Types: 4 Cans of beer, 2 Standard drinks or equivalent per week    Comment: 2 mixed drinks and 4 beers a week    Drug use: No    Comment: experimented with marijuana as a teenager   Sexual activity: Yes    Partners: Female    Birth control/protection: Other-see comments    Comment: wife had an ablation  Other Topics Concern   Not on file  Social History Narrative   Married, has 4 daughters, mother-in-law helps them out ( taking care of children and financially )    Social Determinants of Health   Financial Resource Strain: Low Risk  (11/12/2021)   Overall Financial Resource Strain (CARDIA)    Difficulty of Paying Living  Expenses: Not hard at all  Food Insecurity: No Food Insecurity (11/12/2021)   Hunger Vital Sign    Worried About Running Out of Food in the Last Year: Never true    Ran Out of Food in the Last Year: Never true  Transportation Needs: No Transportation Needs (11/12/2021)   PRAPARE - Administrator, Civil Service (Medical): No    Lack of Transportation (Non-Medical): No  Physical Activity: Insufficiently Active (11/12/2021)   Exercise Vital Sign    Days of Exercise per Week: 2 days    Minutes of Exercise per Session: 60 min  Stress: Stress Concern Present (11/12/2021)   [employer] of Occupational Health - Occupational Stress Questionnaire    Feeling of Stress : To some extent  Social Connections: Moderately Isolated  (11/12/2021)   Social Connection and Isolation Panel [NHANES]    Frequency of Communication with Friends and Family: Three times a week    Frequency of Social Gatherings with Friends and Family: Three times a week    Attends Religious Services: Never    Active Member of Clubs or Organizations: No    Attends Banker Meetings: Never    Marital Status: Married  Catering manager Violence: Not At Risk (11/12/2021)   Humiliation, Afraid, Rape, and Kick questionnaire    Fear of Current or Ex-Partner: No    Emotionally Abused: No    Physically Abused: No    Sexually Abused: No    Review of Systems: See HPI, otherwise negative ROS  Physical Exam: BP 109/85   Pulse (!) 55   Temp (!) 96.1 F (35.6 C) (Temporal)   Resp 20   Ht 5\' 10"  (1.778 m)   Wt 69.5 kg   SpO2 100%   BMI 21.98 kg/m  General:   Alert,  pleasant and cooperative in NAD Head:  Normocephalic and atraumatic. Neck:  Supple; no masses or thyromegaly. Lungs:  Clear throughout to auscultation.    Heart:  Regular rate and rhythm. Abdomen:  Soft, nontender and nondistended. Normal bowel sounds, without guarding, and without rebound.   Neurologic:  Alert and  oriented x4;  grossly normal neurologically.  Impression/Plan: Ralph Mathews is here for an colonoscopy to be performed for colon cancer screening  Risks, benefits, limitations, and alternatives regarding  colonoscopy have been reviewed with the patient.  Questions have been answered.  All parties agreeable.   Lannette Donath, MD  01/16/2023, 9:28 AM

## 2023-01-16 NOTE — Anesthesia Postprocedure Evaluation (Signed)
Anesthesia Post Note  Patient: Ralph Mathews  Procedure(s) Performed: COLONOSCOPY WITH PROPOFOL  Patient location during evaluation: PACU Anesthesia Type: General Level of consciousness: awake and alert, oriented and patient cooperative Pain management: pain level controlled Vital Signs Assessment: post-procedure vital signs reviewed and stable Respiratory status: spontaneous breathing, nonlabored ventilation and respiratory function stable Cardiovascular status: blood pressure returned to baseline and stable Postop Assessment: adequate PO intake Anesthetic complications: no   No notable events documented.   Last Vitals:  Vitals:   01/16/23 1016 01/16/23 1026  BP: 101/73 108/77  Pulse: 64 (!) 58  Resp: 19 16  Temp:    SpO2: 100% 100%    Last Pain:  Vitals:   01/16/23 1026  TempSrc:   PainSc: 0-No pain                 Reed Breech

## 2023-01-16 NOTE — Anesthesia Preprocedure Evaluation (Addendum)
Anesthesia Evaluation  Patient identified by MRN, date of birth, ID band Patient awake    Reviewed: Allergy & Precautions, NPO status , Patient's Chart, lab work & pertinent test results  History of Anesthesia Complications Negative for: history of anesthetic complications  Airway Mallampati: I   Neck ROM: Full    Dental no notable dental hx.    Pulmonary neg pulmonary ROS   Pulmonary exam normal breath sounds clear to auscultation       Cardiovascular Exercise Tolerance: Good negative cardio ROS Normal cardiovascular exam Rhythm:Regular Rate:Normal     Neuro/Psych  Headaches PSYCHIATRIC DISORDERS  Depression       GI/Hepatic ,GERD  ,,  Endo/Other  negative endocrine ROS    Renal/GU Renal disease (nephrolithiasis)     Musculoskeletal  (+) Arthritis ,    Abdominal   Peds  Hematology negative hematology ROS (+)   Anesthesia Other Findings   Reproductive/Obstetrics                             Anesthesia Physical Anesthesia Plan  ASA: 2  Anesthesia Plan: General   Post-op Pain Management:    Induction: Intravenous  PONV Risk Score and Plan: 2 and Propofol infusion, TIVA and Treatment may vary due to age or medical condition  Airway Management Planned: Natural Airway  Additional Equipment:   Intra-op Plan:   Post-operative Plan:   Informed Consent: I have reviewed the patients History and Physical, chart, labs and discussed the procedure including the risks, benefits and alternatives for the proposed anesthesia with the patient or authorized representative who has indicated his/her understanding and acceptance.       Plan Discussed with: CRNA  Anesthesia Plan Comments: (LMA/GETA backup discussed.  Patient consented for risks of anesthesia including but not limited to:  - adverse reactions to medications - damage to eyes, teeth, lips or other oral mucosa - nerve damage  due to positioning  - sore throat or hoarseness - damage to heart, brain, nerves, lungs, other parts of body or loss of life  Informed patient about role of CRNA in peri- and intra-operative care.  Patient voiced understanding.)       Anesthesia Quick Evaluation

## 2023-01-16 NOTE — Op Note (Signed)
[facility] Gastroenterology Patient Name: Ralph Mathews Procedure Date: 01/16/2023 9:37 AM MRN: [id number] Account #: [id number] Date of Birth: 1976-07-13 Admit Type: Outpatient Age: 47 Room: [facility] ENDO ROOM 4 Gender: Male Note Status: Finalized Instrument Name: Prentice Docker [id number] Procedure:             Colonoscopy Indications:           Screening for colorectal malignant neoplasm, This is                         the patient's first colonoscopy Providers:             Toney Reil MD, MD Referring MD:          Onnie Boer. Sowles, MD (Referring MD) Medicines:             General Anesthesia Complications:         No immediate complications. Estimated blood loss: None. Procedure:             Pre-Anesthesia Assessment:                        - Prior to the procedure, a History and Physical was                         performed, and patient medications and allergies were                         reviewed. The patient is competent. The risks and                         benefits of the procedure and the sedation options and                         risks were discussed with the patient. All questions                         were answered and informed consent was obtained.                         Patient identification and proposed procedure were                         verified by the physician, the nurse, the                         anesthesiologist, the anesthetist and the technician                         in the pre-procedure area in the procedure room in the                         endoscopy suite. Mental Status Examination: alert and                         oriented. Airway Examination: normal oropharyngeal                         airway and neck mobility. Respiratory Examination:  clear to auscultation. CV Examination: normal.                         Prophylactic Antibiotics: The patient does not require                          prophylactic antibiotics. Prior Anticoagulants: The                         patient has taken no anticoagulant or antiplatelet                         agents. ASA Grade Assessment: II - A patient with mild                         systemic disease. After reviewing the risks and                         benefits, the patient was deemed in satisfactory                         condition to undergo the procedure. The anesthesia                         plan was to use general anesthesia. Immediately prior                         to administration of medications, the patient was                         re-assessed for adequacy to receive sedatives. The                         heart rate, respiratory rate, oxygen saturations,                         blood pressure, adequacy of pulmonary ventilation, and                         response to care were monitored throughout the                         procedure. The physical status of the patient was                         re-assessed after the procedure.                        After obtaining informed consent, the colonoscope was                         passed under direct vision. Throughout the procedure,                         the patient's blood pressure, pulse, and oxygen                         saturations were monitored continuously. The  Colonoscope was introduced through the anus and                         advanced to the the cecum, identified by appendiceal                         orifice and ileocecal valve. The colonoscopy was                         performed without difficulty. The patient tolerated                         the procedure well. The quality of the bowel                         preparation was evaluated using the BBPS [facility] Bowel                         Preparation Scale) with scores of: Right Colon = 3,                         Transverse Colon = 3 and Left Colon = 3 (entire mucosa                          seen well with no residual staining, small fragments                         of stool or opaque liquid). The total BBPS score                         equals 9. The ileocecal valve, appendiceal orifice,                         and rectum were photographed. Findings:      The perianal and digital rectal examinations were normal. Pertinent       negatives include normal sphincter tone and no palpable rectal lesions.      The entire examined colon appeared normal.      The retroflexed view of the distal rectum and anal verge was normal and       showed no anal or rectal abnormalities. Impression:            - The entire examined colon is normal.                        - The distal rectum and anal verge are normal on                         retroflexion view.                        - No specimens collected. Recommendation:        - Discharge patient to home (with escort).                        - Resume previous diet today.                        -  Continue present medications.                        - Repeat colonoscopy in 10 years for screening                         purposes. Procedure Code(s):     --- Professional ---                        Z6109, Colorectal cancer screening; colonoscopy on                         individual not meeting criteria for high risk Diagnosis Code(s):     --- Professional ---                        Z12.11, Encounter for screening for malignant neoplasm                         of colon CPT copyright 2022 American Medical Association. All rights reserved. The codes documented in this report are preliminary and upon coder review may  be revised to meet current compliance requirements. Dr. Libby Maw Toney Reil MD, MD 01/16/2023 10:04:35 AM This report has been signed electronically. Number of Addenda: 0 Note Initiated On: 01/16/2023 9:37 AM Scope Withdrawal Time: 0 hours 12 minutes 48 seconds  Total Procedure Duration: 0 hours 17 minutes 32  seconds  Estimated Blood Loss:  Estimated blood loss: none.      [facility]

## 2023-01-16 NOTE — Transfer of Care (Signed)
Immediate Anesthesia Transfer of Care Note  Patient: Ralph Mathews  Procedure(s) Performed: Procedure(s): COLONOSCOPY WITH PROPOFOL (N/A)  Patient Location: PACU and Endoscopy Unit  Anesthesia Type:General  Level of Consciousness: sedated  Airway & Oxygen Therapy: Patient Spontanous Breathing and Patient connected to nasal cannula oxygen  Post-op Assessment: Report given to RN and Post -op Vital signs reviewed and stable  Post vital signs: Reviewed and stable  Last Vitals:  Vitals:   01/16/23 0914 01/16/23 1006  BP: 109/85 90/63  Pulse: (!) 55 63  Resp: 20 20  Temp: (!) 35.6 C (!) 35.7 C  SpO2: 100% 99%    Complications: No apparent anesthesia complications

## 2023-01-16 NOTE — Anesthesia Procedure Notes (Signed)
Date/Time: 01/16/2023 9:41 AM  Performed by: Stormy Fabian, CRNAPre-anesthesia Checklist: Patient identified, Emergency Drugs available, Suction available and Patient being monitored Patient Re-evaluated:Patient Re-evaluated prior to induction Oxygen Delivery Method: Nasal cannula Induction Type: IV induction Dental Injury: Teeth and Oropharynx as per pre-operative assessment  Comments: Nasal cannula with etCO2 monitoring

## 2023-01-17 ENCOUNTER — Encounter: Payer: Self-pay | Admitting: Gastroenterology

## 2023-02-26 IMAGING — CT CT RENAL STONE PROTOCOL
2 of 4 series · 16 of 46 positions shown, 18 images · non-contrast
Comparison: None.

CLINICAL DATA: Flank pain, kidney stone suspected

Recent lumbar surgery
EXAM:
CT ABDOMEN AND PELVIS WITHOUT CONTRAST
TECHNIQUE: Multidetector CT imaging of the abdomen and pelvis was performed
following the standard protocol without IV contrast.
RADIATION DOSE REDUCTION: This exam was performed according to the
departmental dose-optimization program which includes automated
exposure control, adjustment of the mA and/or kV according to
patient size and/or use of iterative reconstruction technique.

[Series 3: ap without · axial · non-contrast · 0.70mm/px · z∈[-789,-419]mm · 13 of 84 slices shown, 15 images]
[im 5/84  soft-tissue]
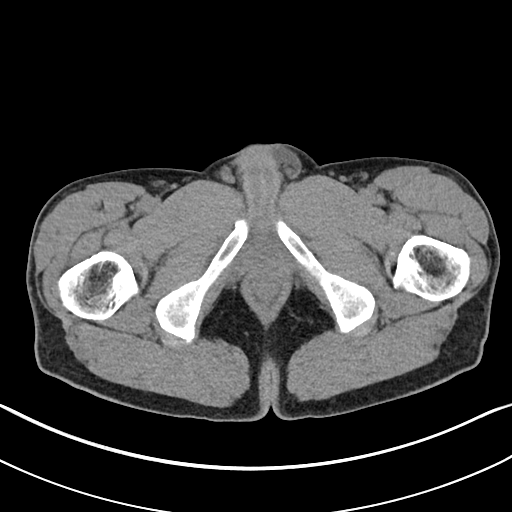
[im 5/84  bone]
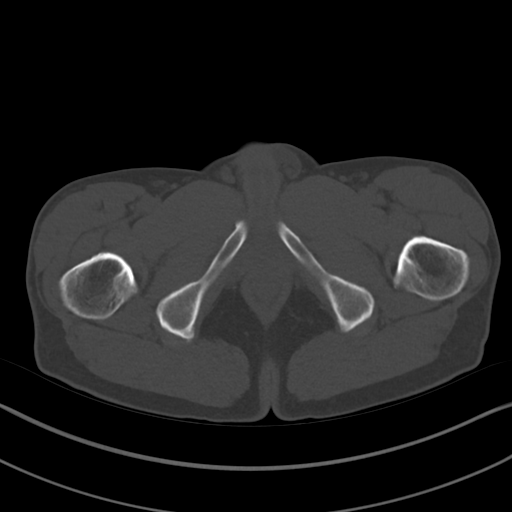
[im 13/84  soft-tissue]
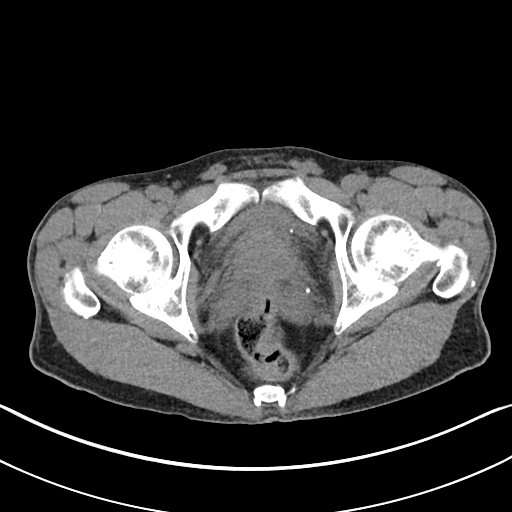
[im 17/84  soft-tissue]
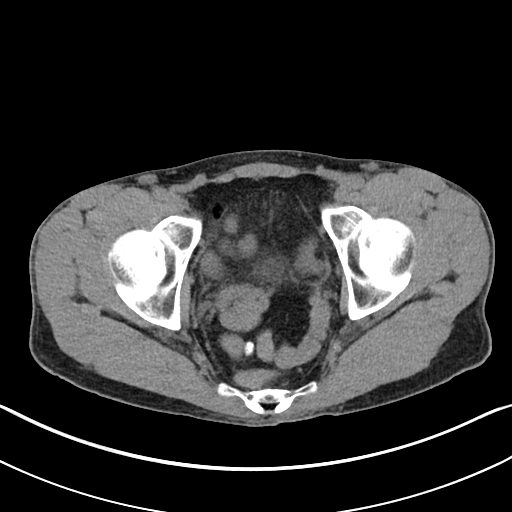
[im 25/84  soft-tissue]
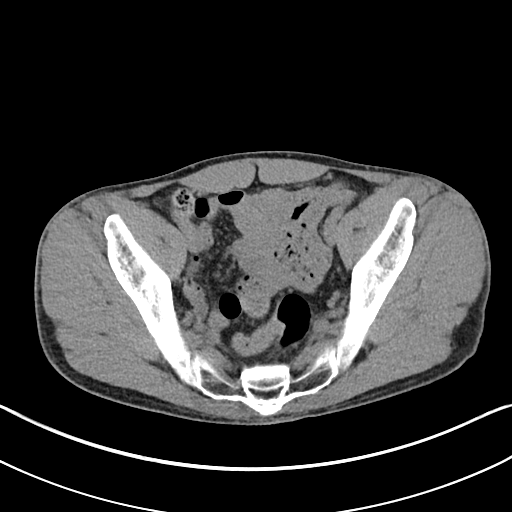
[im 30/84  soft-tissue]
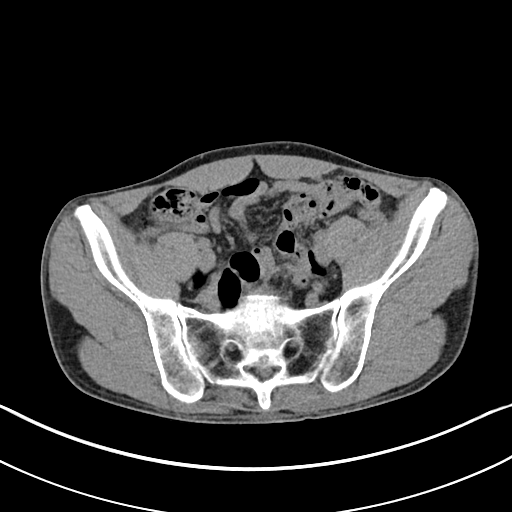
[im 38/84  soft-tissue]
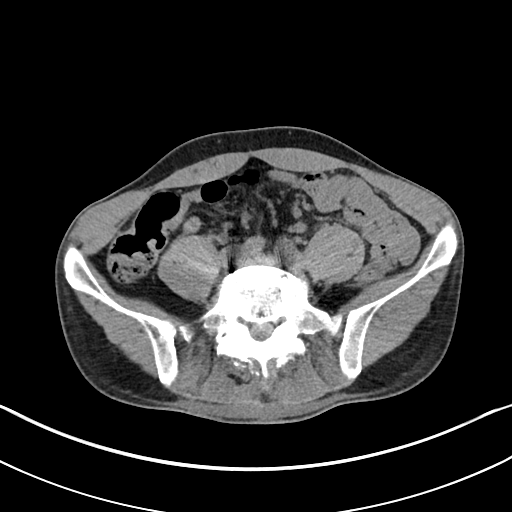
[im 42/84  soft-tissue]
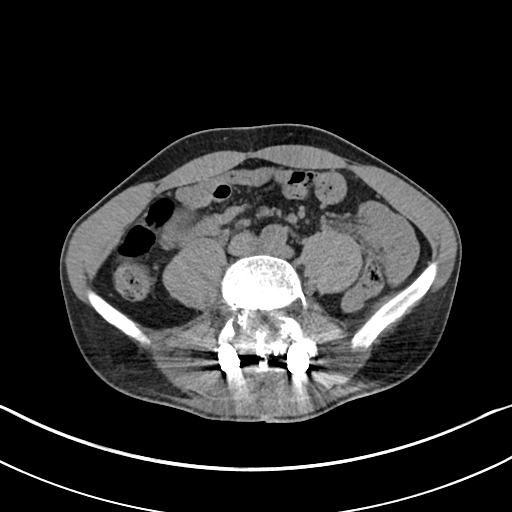
[im 46/84  soft-tissue]
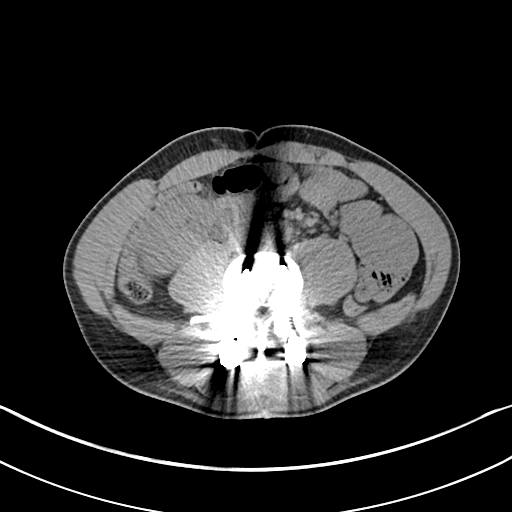
[im 54/84  soft-tissue]
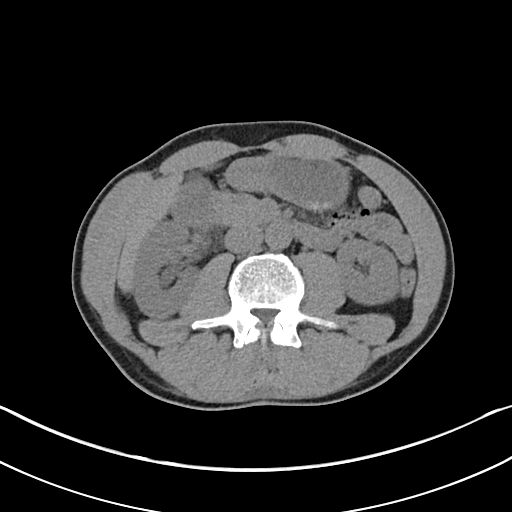
[im 54/84  bone]
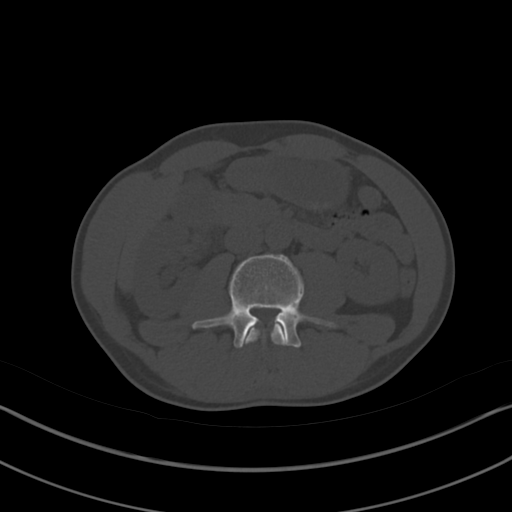
[im 59/84  soft-tissue]
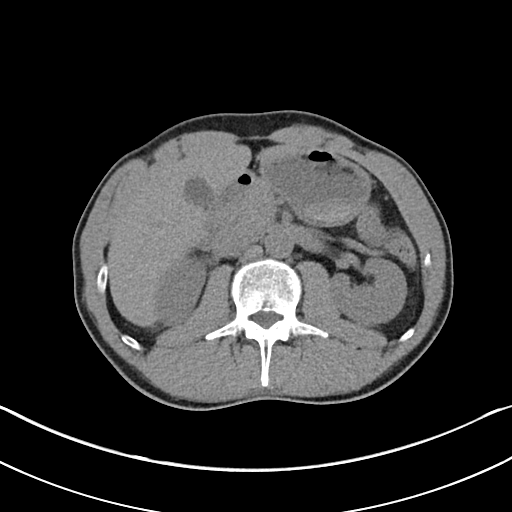
[im 67/84  soft-tissue]
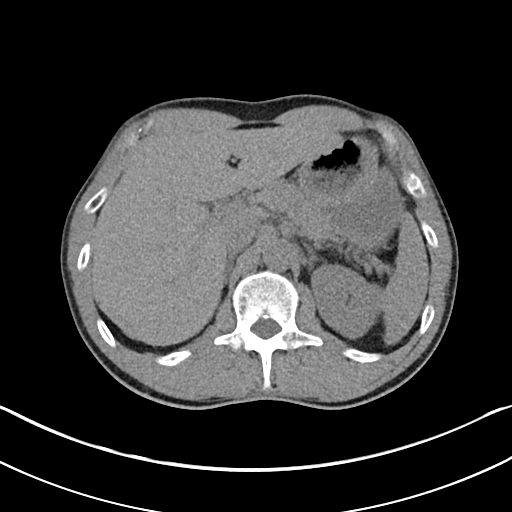
[im 71/84  soft-tissue]
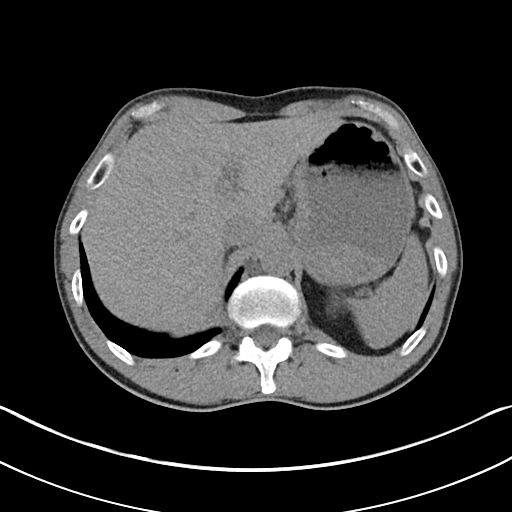
[im 79/84  soft-tissue]
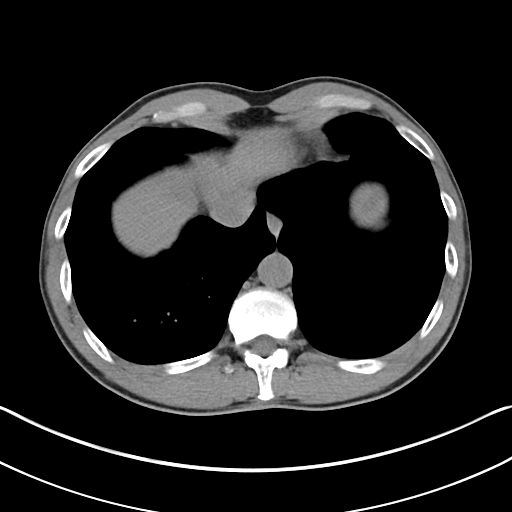

[Series 6: cor · coronal · 0.65mm/px · 3 of 83 slices shown]
[im 28/83  soft-tissue]
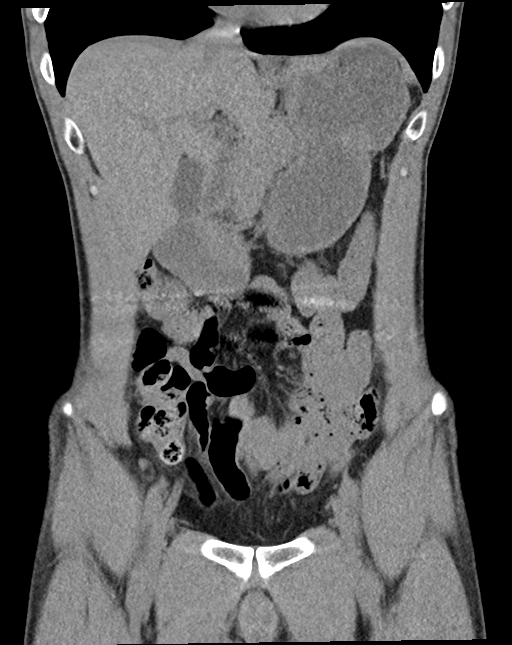
[im 37/83  soft-tissue]
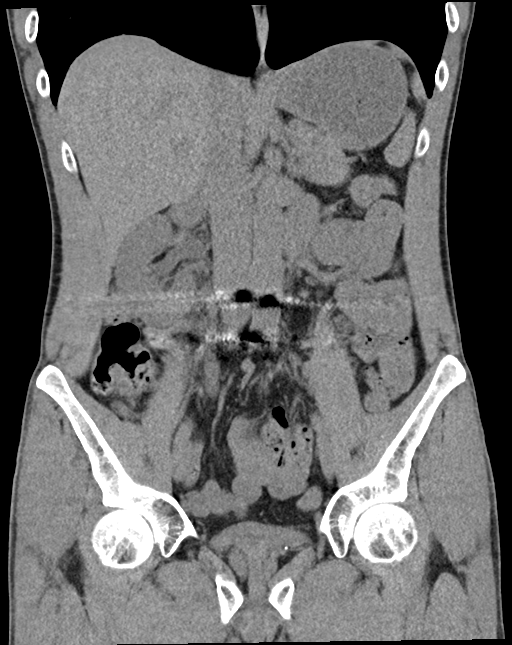
[im 46/83  soft-tissue]
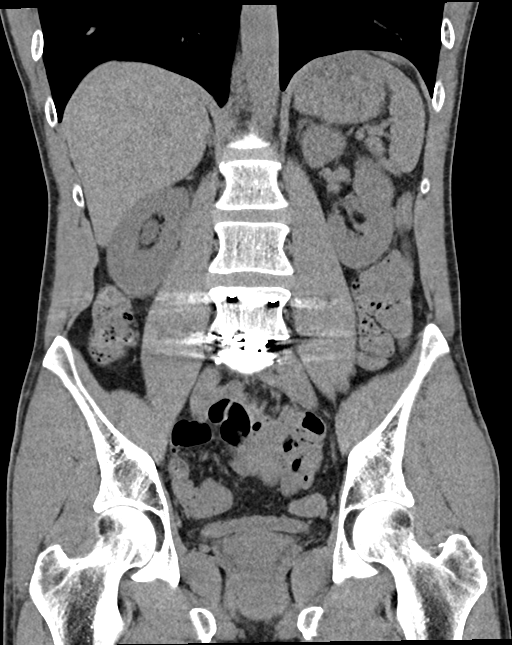

[16 of 46 positions shown; findings below may reference images not displayed]

FINDINGS: Lower chest: The lung bases are clear.

Hepatobiliary: No focal liver abnormality is seen. No gallstones,
gallbladder wall thickening, or biliary dilatation.

Pancreas: No ductal dilatation or inflammation.

Spleen: Normal in size without focal abnormality.

Adrenals/Urinary Tract: No adrenal nodule. Obstructing 4 mm stone at
the right ureterovesicular junction with mild to moderate proximal
hydroureteronephrosis. Mild right perinephric edema. No additional
right renal calculi. There is a punctate nonobstructing stone in the
lower left kidney. No left hydronephrosis. Urinary bladder is near
completely empty.

Stomach/Bowel: Detailed bowel assessment is limited in the absence
of contrast and paucity of intra-abdominal fat. Stomach is
physiologically distended with ingested contents. There is no small
bowel obstruction or inflammation. Appendix is upper normal in
caliber at 6 mm, however no appendicolith or periappendiceal fat
stranding. Small volume of colonic stool.

Vascular/Lymphatic: Normal caliber abdominal aorta. Portal venous or
mesenteric gas. No bulky abdominopelvic adenopathy. Multiple
phleboliths in the pelvis.

Reproductive: Prostate is unremarkable.

Other: No free air, free fluid/ascites, or focal fluid collection.
No abdominal wall hernia.

Musculoskeletal: Posterior lumbar fusion L4-L5 with interbody
spacer.
IMPRESSION: 1. Obstructing 4 mm stone at the right ureterovesicular junction
with mild to moderate proximal hydroureteronephrosis.
2. Punctate nonobstructing stone in the lower left kidney.

## 2023-03-20 ENCOUNTER — Encounter: Payer: Commercial Managed Care - PPO | Admitting: Family Medicine

## 2023-05-12 NOTE — Progress Notes (Unsigned)
Name: Ralph Mathews   MRN: [id number]    DOB: 11/15/1975   Date:05/15/2023       Progress Note  Subjective  Chief Complaint  Annual Exam  HPI  Patient presents for annual CPE.  IPSS Questionnaire (AUA-7): Over the past month.   1)  How often have you had a sensation of not emptying your bladder completely after you finish urinating?  0 - Not at all  2)  How often have you had to urinate again less than two hours after you finished urinating? 0 - Not at all  3)  How often have you found you stopped and started again several times when you urinated?  0 - Not at all  4) How difficult have you found it to postpone urination?  0 - Not at all  5) How often have you had a weak urinary stream?  0 - Not at all  6) How often have you had to push or strain to begin urination?  0 - Not at all  7) How many times did you most typically get up to urinate from the time you went to bed until the time you got up in the morning?  0 - None  Total score:  0-7 mildly symptomatic   8-19 moderately symptomatic   20-35 severely symptomatic     Diet: balanced diet  Exercise: he still works out  Last Dental Exam: up to date Last Eye Exam: up to date   Depression: phq 9 is positive      05/15/2023    2:30 PM 12/05/2022    8:22 AM 06/06/2022    9:07 AM 11/12/2021    8:02 AM 05/17/2021    8:22 AM  Depression screen PHQ 2/9  Decreased Interest 1 1 0 0 0  Down, Depressed, Hopeless 1 1 1  0 0  PHQ - 2 Score 2 2 1  0 0  Altered sleeping 0 3 0 0 1  Tired, decreased energy 1 3 3  0 0  Change in appetite 0 0 0 0 0  Feeling bad or failure about yourself  1 0 0 0 0  Trouble concentrating 0 0 0 0 0  Moving slowly or fidgety/restless 0 0 0 0 0  Suicidal thoughts 0 0 0 0 0  PHQ-9 Score 4 8 4  0 1  Difficult doing work/chores    Not difficult at all Not difficult at all    Hypertension:  BP Readings from Last 3 Encounters:  05/15/23 124/68  01/16/23 108/77  12/05/22 118/70    Obesity: Wt Readings from Last 3  Encounters:  05/15/23 158 lb (71.7 kg)  01/16/23 153 lb 3.2 oz (69.5 kg)  12/05/22 158 lb (71.7 kg)   BMI Readings from Last 3 Encounters:  05/15/23 22.67 kg/m  01/16/23 21.98 kg/m  12/05/22 22.67 kg/m     Lipids:  Lab Results  Component Value Date   CHOL 140 11/12/2021   CHOL 167 11/11/2020   CHOL 167 08/17/2018   Lab Results  Component Value Date   HDL 62 11/12/2021   HDL 72 11/11/2020   HDL 63 08/17/2018   Lab Results  Component Value Date   LDLCALC 59 11/12/2021   LDLCALC 80 11/11/2020   LDLCALC 87 08/17/2018   Lab Results  Component Value Date   TRIG 106 11/12/2021   TRIG 69 11/11/2020   TRIG 78 08/17/2018   Lab Results  Component Value Date   CHOLHDL 2.3 11/12/2021   CHOLHDL 2.3 11/11/2020  CHOLHDL 2.7 08/17/2018   No results found for: "LDLDIRECT" Glucose:  Glucose, Bld  Date Value Ref Range Status  11/12/2021 59 (L) 65 - 99 mg/dL Final    Comment:    .            Fasting reference interval .   09/28/2021 157 (H) 70 - 99 mg/dL Final    Comment:    Glucose reference range applies only to samples taken after fasting for at least 8 hours.  09/15/2021 127 (H) 70 - 99 mg/dL Final    Comment:    Glucose reference range applies only to samples taken after fasting for at least 8 hours.    Flowsheet Row Office Visit from 11/12/2021 in [facility]  AUDIT-C Score 0       Married STD testing and prevention (HIV/chl/gon/syphilis): N/A Sexual history: one partner Hep C Screening: 08/17/18 Skin cancer: Discussed monitoring for atypical lesions Colorectal cancer: 01/16/23 Prostate cancer:   Lab Results  Component Value Date   PSA 0.37 11/11/2020     Lung cancer:  Low Dose CT Chest recommended if Age 36-80 years, 30 pack-year currently smoking OR have quit w/in 15years. Patient  is not a candidate for screening   AAA: The USPSTF recommends one-time screening with ultrasonography in men ages 46 to 75 years who have ever  smoked. Patient  is not  a candidate for screening  ECG:  03/06/17  Vaccines:   HPV: N/A Tdap: up to date Shingrix: N/A Pneumonia: N/A Flu: 2023, due COVID-19: up to date  Advanced Care Planning: A voluntary discussion about advance care planning including the explanation and discussion of advance directives.  Discussed health care proxy and Living will, and the patient was able to identify a health care proxy as wife .  Patient does not have a living will and power of attorney of health care   Patient Active Problem List   Diagnosis Date Noted   Screening for colon cancer 01/16/2023   Spondylolisthesis of lumbar region 09/13/2021   Major depression in remission (HCC) 08/05/2016   History of back surgery 08/05/2016   Migraine without aura and without status migrainosus, not intractable 05/25/2015   Insomnia, persistent 04/11/2015   Chronic constipation 04/11/2015   Lumbosacral spondylosis without myelopathy 04/11/2015   Circadian rhythm sleep disorder, shift work type 04/11/2015   Ejaculates too soon 04/11/2015   Perennial allergic rhinitis with seasonal variation 04/11/2015    Past Surgical History:  Procedure Laterality Date   COLONOSCOPY WITH PROPOFOL N/A 01/16/2023   Procedure: COLONOSCOPY WITH PROPOFOL;  Surgeon: Toney Reil, MD;  Location: [facility] ENDOSCOPY;  Service: Gastroenterology;  Laterality: N/A;   LAMINECTOMY  08/14/2014   NASAL SEPTOPLASTY W/ TURBINOPLASTY  09/05/2008   ORIF METATARSAL FRACTURE Right    SPINAL FUSION  09/13/2021    Family History  Problem Relation Age of Onset   Depression Mother    Hypertension Mother    Obesity Mother    Anxiety disorder Father     Social History   Socioeconomic History   Marital status: Married    Spouse name: Marchelle Folks   Number of children: 4   Years of education: 17   Highest education level: Bachelor's degree (e.g., BA, AB, BS)  Occupational History   Occupation: cook   Tobacco Use   Smoking status: Never    Smokeless tobacco: Never  Vaping Use   Vaping status: Never Used  Substance and Sexual Activity   Alcohol use: Yes  Alcohol/week: 6.0 standard drinks of alcohol    Types: 4 Cans of beer, 2 Standard drinks or equivalent per week    Comment: 2 mixed drinks and 4 beers a week    Drug use: No    Comment: experimented with marijuana as a teenager   Sexual activity: Yes    Partners: Female    Birth control/protection: Other-see comments    Comment: wife had an ablation  Other Topics Concern   Not on file  Social History Narrative   Married, has 4 daughters, mother-in-law helps them out ( taking care of children and financially )    Social Determinants of Health   Financial Resource Strain: Low Risk  (05/15/2023)   Overall Financial Resource Strain (CARDIA)    Difficulty of Paying Living Expenses: Not hard at all  Food Insecurity: No Food Insecurity (05/15/2023)   Hunger Vital Sign    Worried About Running Out of Food in the Last Year: Never true    Ran Out of Food in the Last Year: Never true  Transportation Needs: No Transportation Needs (05/15/2023)   PRAPARE - Administrator, Civil Service (Medical): No    Lack of Transportation (Non-Medical): No  Physical Activity: Sufficiently Active (05/15/2023)   Exercise Vital Sign    Days of Exercise per Week: 2 days    Minutes of Exercise per Session: 90 min  Stress: No Stress Concern Present (05/15/2023)   [employer] of Occupational Health - Occupational Stress Questionnaire    Feeling of Stress : Only a little  Social Connections: Moderately Integrated (05/15/2023)   Social Connection and Isolation Panel [NHANES]    Frequency of Communication with Friends and Family: Twice a week    Frequency of Social Gatherings with Friends and Family: Once a week    Attends Religious Services: More than 4 times per year    Active Member of [employer] or Organizations: No    Attends Banker Meetings: Never    Marital Status:  Married  Catering manager Violence: Not At Risk (05/15/2023)   Humiliation, Afraid, Rape, and Kick questionnaire    Fear of Current or Ex-Partner: No    Emotionally Abused: No    Physically Abused: No    Sexually Abused: No     Current Outpatient Medications:    buPROPion (WELLBUTRIN XL) 150 MG 24 hr tablet, Take 1 tablet (150 mg total) by mouth daily., Disp: 90 tablet, Rfl: 1   citalopram (CELEXA) 40 MG tablet, Take 1 tablet (40 mg total) by mouth daily., Disp: 90 tablet, Rfl: 1   montelukast (SINGULAIR) 10 MG tablet, Take 1 tablet (10 mg total) by mouth at bedtime as needed (spring and fall allergies)., Disp: 90 tablet, Rfl: 1   olopatadine (PATANOL) 0.1 % ophthalmic solution, Place 1 drop into both eyes 2 (two) times daily., Disp: 5 mL, Rfl: 2   Rimegepant Sulfate (NURTEC) 75 MG TBDP, Take 1 tablet (75 mg total) by mouth every other day., Disp: 16 tablet, Rfl: 2   SUMAtriptan (IMITREX) 100 MG tablet, Take 1 tablet (100 mg total) by mouth every 2 (two) hours as needed for migraine. May repeat in 2 hours if headache persists or recurs., Disp: 9 tablet, Rfl: 1   traZODone (DESYREL) 100 MG tablet, Take 1 tablet (100 mg total) by mouth every evening., Disp: 90 tablet, Rfl: 1  No Known Allergies   ROS  Constitutional: Negative for fever or weight change.  Respiratory: Negative for cough and shortness of  breath.   Cardiovascular: Negative for chest pain or palpitations.  Gastrointestinal: Negative for abdominal pain, no bowel changes.  Musculoskeletal: Negative for gait problem or joint swelling. Paresthesia on ball of right foot when walking for a long time, advised to contact neurosurgeon - history of back surgery  Skin: Negative for rash.  Neurological: Negative for dizziness or headache.  No other specific complaints in a complete review of systems (except as listed in HPI above).    Objective  Vitals:   05/15/23 1431  BP: 124/68  Pulse: 72  Resp: 16  SpO2: 96%  Weight: 158 lb  (71.7 kg)  Height: 5\' 10"  (1.778 m)    Body mass index is 22.67 kg/m.  Physical Exam  Constitutional: Patient appears well-developed and well-nourished. No distress.  HENT: Head: Normocephalic and atraumatic. Ears: B TMs ok, no erythema or effusion; Nose: Nose normal. Mouth/Throat: Oropharynx is clear and moist. No oropharyngeal exudate.  Eyes: Conjunctivae and EOM are normal. Pupils are equal, round, and reactive to light. No scleral icterus.  Neck: Normal range of motion. Neck supple. No JVD present. No thyromegaly present.  Cardiovascular: Normal rate, regular rhythm and normal heart sounds.  No murmur heard. No BLE edema. Pulmonary/Chest: Effort normal and breath sounds normal. No respiratory distress. Abdominal: Soft. Bowel sounds are normal, no distension. There is no tenderness. no masses MALE GENITALIA: Normal descended testes bilaterally, no masses palpated, small hernia seems to be present on right side ( indirect ) , no lesions, no discharge RECTAL: not done  Musculoskeletal: Normal range of motion, no joint effusions. No gross deformities Neurological: he is alert and oriented to person, place, and time. No cranial nerve deficit. Coordination, balance, strength, speech and gait are normal.  Skin: Skin is warm and dry. No rash noted. No erythema.  Psychiatric: Patient has a normal mood and affect. behavior is normal. Judgment and thought content normal.   Fall Risk:    05/15/2023    2:30 PM 12/05/2022    8:22 AM 06/06/2022    9:07 AM 11/12/2021    8:02 AM 05/17/2021    8:22 AM  Fall Risk   Falls in the past year? 0 1 1 0 0  Number falls in past yr: 0 1 1 0 0  Injury with Fall? 0 0 0 0 0  Risk for fall due to : No Fall Risks No Fall Risks No Fall Risks No Fall Risks No Fall Risks  Follow up Falls prevention discussed Falls prevention discussed Falls prevention discussed Falls prevention discussed Falls prevention discussed     Functional Status Survey: Is the patient deaf  or have difficulty hearing?: No Does the patient have difficulty seeing, even when wearing glasses/contacts?: No Does the patient have difficulty concentrating, remembering, or making decisions?: No Does the patient have difficulty walking or climbing stairs?: No Does the patient have difficulty dressing or bathing?: No Does the patient have difficulty doing errands alone such as visiting a doctor's office or shopping?: No    Assessment & Plan  1. Well adult exam  - Lipid panel - CBC with Differential/Platelet - COMPLETE METABOLIC PANEL WITH GFR - B12 and Folate Panel - VITAMIN D 25 Hydroxy (Vit-D Deficiency, Fractures) - TSH - Hemoglobin A1c  2. Lipid screening  - Lipid panel  3. Screening for diabetes mellitus  - Hemoglobin A1c  4. Long-term use of high-risk medication  - CBC with Differential/Platelet - COMPLETE METABOLIC PANEL WITH GFR  5. Other fatigue  - B12 and  Folate Panel - VITAMIN D 25 Hydroxy (Vit-D Deficiency, Fractures) - TSH  6. Needs flu shot  - Flu vaccine trivalent PF, 6mos and older(Flulaval,Afluria,Fluarix,Fluzone)  7. Facial rash  - Ambulatory referral to Dermatology   8. Inguinal hernia of right side without obstruction or gangrene  Discussed symptoms or incarceration and when to go to [facility]. He does not want to see surgeon at this time    -Prostate cancer screening and PSA options (with potential risks and benefits of testing vs not testing) were discussed along with recent recs/guidelines. -USPSTF grade A and B recommendations reviewed with patient; age-appropriate recommendations, preventive care, screening tests, etc discussed and encouraged; healthy living encouraged; see AVS for patient education given to patient -Discussed importance of 150 minutes of physical activity weekly, eat two servings of fish weekly, eat one serving of tree nuts ( cashews, pistachios, pecans, almonds.Marland Kitchen) every other day, eat 6 servings of fruit/vegetables daily  and drink plenty of water and avoid sweet beverages.  -Reviewed Health Maintenance: yes

## 2023-05-15 ENCOUNTER — Encounter: Payer: Self-pay | Admitting: Family Medicine

## 2023-05-15 ENCOUNTER — Ambulatory Visit (INDEPENDENT_AMBULATORY_CARE_PROVIDER_SITE_OTHER): Payer: Commercial Managed Care - PPO | Admitting: Family Medicine

## 2023-05-15 VITALS — BP 124/68 | HR 72 | Resp 16 | Ht 70.0 in | Wt 158.0 lb

## 2023-05-15 DIAGNOSIS — K4090 Unilateral inguinal hernia, without obstruction or gangrene, not specified as recurrent: Secondary | ICD-10-CM | POA: Insufficient documentation

## 2023-05-15 DIAGNOSIS — Z0001 Encounter for general adult medical examination with abnormal findings: Secondary | ICD-10-CM

## 2023-05-15 DIAGNOSIS — R21 Rash and other nonspecific skin eruption: Secondary | ICD-10-CM

## 2023-05-15 DIAGNOSIS — Z79899 Other long term (current) drug therapy: Secondary | ICD-10-CM

## 2023-05-15 DIAGNOSIS — Z0000 Encounter for general adult medical examination without abnormal findings: Secondary | ICD-10-CM

## 2023-05-15 DIAGNOSIS — R5383 Other fatigue: Secondary | ICD-10-CM

## 2023-05-15 DIAGNOSIS — Z23 Encounter for immunization: Secondary | ICD-10-CM

## 2023-05-15 DIAGNOSIS — Z131 Encounter for screening for diabetes mellitus: Secondary | ICD-10-CM | POA: Diagnosis not present

## 2023-05-15 DIAGNOSIS — Z13220 Encounter for screening for lipoid disorders: Secondary | ICD-10-CM

## 2023-05-16 LAB — HEMOGLOBIN A1C
Hgb A1c MFr Bld: 5.5 %{Hb} (ref <5.7)
Mean Plasma Glucose: 111 mg/dL
eAG (mmol/L): 6.2 mmol/L

## 2023-05-16 LAB — CBC WITH DIFFERENTIAL/PLATELET
Absolute Monocytes: 620 {cells}/uL (ref 200–950)
Basophils Absolute: 20 {cells}/uL (ref 0–200)
Basophils Relative: 0.3 %
Eosinophils Absolute: 40 {cells}/uL (ref 15–500)
Eosinophils Relative: 0.6 %
HCT: 46.2 % (ref 38.5–50.0)
Hemoglobin: 15.2 g/dL (ref 13.2–17.1)
Lymphs Abs: 1003 {cells}/uL (ref 850–3900)
MCH: 30.5 pg (ref 27.0–33.0)
MCHC: 32.9 g/dL (ref 32.0–36.0)
MCV: 92.8 fL (ref 80.0–100.0)
MPV: 9.2 fL (ref 7.5–12.5)
Monocytes Relative: 9.4 %
Neutro Abs: 4917 {cells}/uL (ref 1500–7800)
Neutrophils Relative %: 74.5 %
Platelets: 236 10*3/uL (ref 140–400)
RBC: 4.98 10*6/uL (ref 4.20–5.80)
RDW: 13.2 % (ref 11.0–15.0)
Total Lymphocyte: 15.2 %
WBC: 6.6 10*3/uL (ref 3.8–10.8)

## 2023-05-16 LAB — COMPLETE METABOLIC PANEL WITH GFR
AG Ratio: 1.8 (calc) (ref 1.0–2.5)
ALT: 14 U/L (ref 9–46)
AST: 17 U/L (ref 10–40)
Albumin: 4.2 g/dL (ref 3.6–5.1)
Alkaline phosphatase (APISO): 64 U/L (ref 36–130)
BUN: 17 mg/dL (ref 7–25)
CO2: 29 mmol/L (ref 20–32)
Calcium: 9.6 mg/dL (ref 8.6–10.3)
Chloride: 104 mmol/L (ref 98–110)
Creat: 1.06 mg/dL (ref 0.60–1.29)
Globulin: 2.3 g/dL (ref 1.9–3.7)
Glucose, Bld: 82 mg/dL (ref 65–99)
Potassium: 4.5 mmol/L (ref 3.5–5.3)
Sodium: 141 mmol/L (ref 135–146)
Total Bilirubin: 0.5 mg/dL (ref 0.2–1.2)
Total Protein: 6.5 g/dL (ref 6.1–8.1)
eGFR: 88 mL/min/{1.73_m2} (ref >=60)

## 2023-05-16 LAB — LIPID PANEL
Cholesterol: 169 mg/dL (ref <200)
HDL: 66 mg/dL (ref >=40)
LDL Cholesterol (Calc): 80 mg/dL
Non-HDL Cholesterol (Calc): 103 mg/dL (ref <130)
Total CHOL/HDL Ratio: 2.6 (calc) (ref <5.0)
Triglycerides: 131 mg/dL (ref <150)

## 2023-05-16 LAB — B12 AND FOLATE PANEL
Folate: 17.2 ng/mL
Vitamin B-12: 338 pg/mL (ref 200–1100)

## 2023-05-16 LAB — VITAMIN D 25 HYDROXY (VIT D DEFICIENCY, FRACTURES): Vit D, 25-Hydroxy: 55 ng/mL (ref 30–100)

## 2023-05-16 LAB — TSH: TSH: 0.68 m[IU]/L (ref 0.40–4.50)

## 2023-06-08 ENCOUNTER — Other Ambulatory Visit: Payer: Self-pay | Admitting: Family Medicine

## 2023-06-08 DIAGNOSIS — G4700 Insomnia, unspecified: Secondary | ICD-10-CM

## 2023-06-08 DIAGNOSIS — F339 Major depressive disorder, recurrent, unspecified: Secondary | ICD-10-CM

## 2023-06-09 NOTE — Progress Notes (Unsigned)
Name: Ralph Mathews   MRN: [id number]    DOB: [birth year]   Date:06/12/2023       Progress Note  Subjective  Chief Complaint  Follow Up  HPI  History of back surgery: he had laminectomy back  2015 by Dr. Gerlene Fee, he had some recurrence of radiculitis on right side starting 08/2016 symptoms got progressively worse and had a lumbar fusion L4-L5 by Dr. Lovell Sheehan 09/13/2021, he is doing well now, he states pain is now sporadic, he is under the care of chiropractor now for upper back and neck, but down to once a month. States no longer has radiculitis    Depression: taking Citalopram for many years and also  added Wellbutrin June 2020.  No side effects of medication. 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 Phq 9 is still positive  He is switching jobs soon. Waiting for the last step to go to [employer]. He states he will have to work night shift for the first week.   History of kidney stones: went to [facility] back in January 2023 and passed the stone on the right. No episodes since. He has been released from Urologist Unchanged    Migraine Headaches: he has episodes of left temporal pain , it starts quickly and is very intense.  Pain is  described as starting as a throbbing sensation and sometimes radiates to frontal area, has phonophobia and photophobia, occasionally associated with nausea but no vomiting in a long time. Respond to Imitrex and Nurtec, sometimes no episodes in a month but other months more often. We will add zofran   Night sweats : symptoms started after back surgery January 2023 , he states goes to bed cold and has one or two blankets and wakes up sweaty.  Discussed using a baby blanket instead    Insomnia: he has been taking Trazodone and is able to fall and stay asleep most nights, he goes to bed at 10 pm and has to wake up at 5 am.  He  states episodes of vivid dreams are seldom now .  We referred him to neurologist but he lost to follow up He states they also have a larger bed  and that has helped   Perennial AR: continue medication . Takes singulair and eye drops, prn antihistamine . Doing well   Patient Active Problem List   Diagnosis Date Noted   Inguinal hernia of right side without obstruction or gangrene 05/15/2023   Screening for colon cancer 01/16/2023   Spondylolisthesis of lumbar region 09/13/2021   Major depression in remission (HCC) 08/05/2016   History of back surgery 08/05/2016   Migraine without aura and without status migrainosus, not intractable 05/25/2015   Insomnia, persistent 04/11/2015   Chronic constipation 04/11/2015   Lumbosacral spondylosis without myelopathy 04/11/2015   Circadian rhythm sleep disorder, shift work type 04/11/2015   Ejaculates too soon 04/11/2015   Perennial allergic rhinitis with seasonal variation 04/11/2015    Past Surgical History:  Procedure Laterality Date   COLONOSCOPY WITH PROPOFOL N/A 01/16/2023   Procedure: COLONOSCOPY WITH PROPOFOL;  Surgeon: Toney Reil, MD;  Location: [facility] ENDOSCOPY;  Service: Gastroenterology;  Laterality: N/A;   LAMINECTOMY  08/14/2014   NASAL SEPTOPLASTY W/ TURBINOPLASTY  09/05/2008   ORIF METATARSAL FRACTURE Right    SPINAL FUSION  09/13/2021    Family History  Problem Relation Age of Onset   Depression Mother    Hypertension Mother    Obesity Mother    Anxiety disorder  Father     Social History   Tobacco Use   Smoking status: Never   Smokeless tobacco: Never  Substance Use Topics   Alcohol use: Yes    Alcohol/week: 6.0 standard drinks of alcohol    Types: 4 Cans of beer, 2 Standard drinks or equivalent per week    Comment: 2 mixed drinks and 4 beers a week      Current Outpatient Medications:    buPROPion (WELLBUTRIN XL) 150 MG 24 hr tablet, TAKE 1 TABLET BY MOUTH EVERY DAY, Disp: 90 tablet, Rfl: 1   citalopram (CELEXA) 40 MG tablet, Take 1 tablet (40 mg total) by mouth daily., Disp: 90 tablet, Rfl: 1   montelukast (SINGULAIR) 10 MG tablet, Take 1 tablet  (10 mg total) by mouth at bedtime as needed (spring and fall allergies)., Disp: 90 tablet, Rfl: 1   olopatadine (PATANOL) 0.1 % ophthalmic solution, Place 1 drop into both eyes 2 (two) times daily., Disp: 5 mL, Rfl: 2   Rimegepant Sulfate (NURTEC) 75 MG TBDP, Take 1 tablet (75 mg total) by mouth every other day., Disp: 16 tablet, Rfl: 2   SUMAtriptan (IMITREX) 100 MG tablet, Take 1 tablet (100 mg total) by mouth every 2 (two) hours as needed for migraine. May repeat in 2 hours if headache persists or recurs., Disp: 9 tablet, Rfl: 1   traZODone (DESYREL) 100 MG tablet, TAKE 1 TABLET BY MOUTH EVERY EVENING., Disp: 90 tablet, Rfl: 1  No Known Allergies  I personally reviewed active problem list, medication list, allergies, family history, social history, health maintenance with the patient/caregiver today.   ROS  Ten systems reviewed and is negative except as mentioned in HPI    Objective  Vitals:   06/12/23 1128  BP: 116/68  Pulse: 83  Resp: 16  SpO2: 98%  Weight: 157 lb (71.2 kg)  Height: 5\' 10"  (1.778 m)    Body mass index is 22.53 kg/m.  Physical Exam  Constitutional: Patient appears well-developed and well-nourished.  No distress.  HEENT: head atraumatic, normocephalic, pupils equal and reactive to light, neck supple Cardiovascular: Normal rate, regular rhythm and normal heart sounds.  No murmur heard. No BLE edema. Pulmonary/Chest: Effort normal and breath sounds normal. No respiratory distress. Abdominal: Soft.  There is no tenderness. Psychiatric: Patient has a normal mood and affect. behavior is normal. Judgment and thought content normal.    PHQ2/9:    06/12/2023   11:31 AM 05/15/2023    2:30 PM 12/05/2022    8:22 AM 06/06/2022    9:07 AM 11/12/2021    8:02 AM  Depression screen PHQ 2/9  Decreased Interest 1 1 1  0 0  Down, Depressed, Hopeless 1 1 1 1  0  PHQ - 2 Score 2 2 2 1  0  Altered sleeping 3 0 3 0 0  Tired, decreased energy 3 1 3 3  0  Change in appetite 0 0  0 0 0  Feeling bad or failure about yourself  0 1 0 0 0  Trouble concentrating 0 0 0 0 0  Moving slowly or fidgety/restless 0 0 0 0 0  Suicidal thoughts 0 0 0 0 0  PHQ-9 Score 8 4 8 4  0  Difficult doing work/chores     Not difficult at all    phq 9 is positive   Fall Risk:    06/12/2023   11:28 AM 05/15/2023    2:30 PM 12/05/2022    8:22 AM 06/06/2022    9:07 AM 11/12/2021  8:02 AM  Fall Risk   Falls in the past year? 0 0 1 1 0  Number falls in past yr: 0 0 1 1 0  Injury with Fall? 0 0 0 0 0  Risk for fall due to : No Fall Risks No Fall Risks No Fall Risks No Fall Risks No Fall Risks  Follow up Falls prevention discussed Falls prevention discussed Falls prevention discussed Falls prevention discussed Falls prevention discussed      Functional Status Survey: Is the patient deaf or have difficulty hearing?: No Does the patient have difficulty seeing, even when wearing glasses/contacts?: No Does the patient have difficulty concentrating, remembering, or making decisions?: No Does the patient have difficulty walking or climbing stairs?: No Does the patient have difficulty dressing or bathing?: No Does the patient have difficulty doing errands alone such as visiting a doctor's office or shopping?: No    Assessment & Plan  1. Migraine without aura and without status migrainosus, not intractable  - ondansetron (ZOFRAN) 4 MG tablet; Take 1 tablet (4 mg total) by mouth every 8 (eight) hours as needed for nausea or vomiting.  Dispense: 20 tablet; Refill: 0 - Rimegepant Sulfate (NURTEC) 75 MG TBDP; Take 1 tablet (75 mg total) by mouth every other day.  Dispense: 16 tablet; Refill: 2 - SUMAtriptan (IMITREX) 100 MG tablet; Take 1 tablet (100 mg total) by mouth every 2 (two) hours as needed for migraine. May repeat in 2 hours if headache persists or recurs.  Dispense: 9 tablet; Refill: 1  2. Major depression, recurrent, chronic (HCC)  - citalopram (CELEXA) 40 MG tablet; Take 1 tablet  (40 mg total) by mouth daily.  Dispense: 90 tablet; Refill: 1 - buPROPion (WELLBUTRIN XL) 150 MG 24 hr tablet; Take 1 tablet (150 mg total) by mouth daily.  Dispense: 90 tablet; Refill: 1  3. Perennial allergic rhinitis with seasonal variation  - montelukast (SINGULAIR) 10 MG tablet; Take 1 tablet (10 mg total) by mouth at bedtime as needed (spring and fall allergies).  Dispense: 90 tablet; Refill: 1  4. Insomnia, persistent  - traZODone (DESYREL) 100 MG tablet; Take 1 tablet (100 mg total) by mouth every evening.  Dispense: 90 tablet; Refill: 1  5. Low serum vitamin B12  - Cyanocobalamin (B-12) 1000 MCG SUBL; Place 1 tablet under the tongue 2 (two) times a week.  Dispense: 30 tablet; Refill: 0

## 2023-06-12 ENCOUNTER — Ambulatory Visit: Payer: Commercial Managed Care - PPO | Admitting: Family Medicine

## 2023-06-12 ENCOUNTER — Encounter: Payer: Self-pay | Admitting: Family Medicine

## 2023-06-12 VITALS — BP 116/68 | HR 83 | Resp 16 | Ht 70.0 in | Wt 157.0 lb

## 2023-06-12 DIAGNOSIS — G4700 Insomnia, unspecified: Secondary | ICD-10-CM

## 2023-06-12 DIAGNOSIS — J302 Other seasonal allergic rhinitis: Secondary | ICD-10-CM

## 2023-06-12 DIAGNOSIS — J3089 Other allergic rhinitis: Secondary | ICD-10-CM

## 2023-06-12 DIAGNOSIS — G43009 Migraine without aura, not intractable, without status migrainosus: Secondary | ICD-10-CM | POA: Diagnosis not present

## 2023-06-12 DIAGNOSIS — E538 Deficiency of other specified B group vitamins: Secondary | ICD-10-CM

## 2023-06-12 DIAGNOSIS — F339 Major depressive disorder, recurrent, unspecified: Secondary | ICD-10-CM | POA: Diagnosis not present

## 2023-06-12 MED ORDER — CITALOPRAM HYDROBROMIDE 40 MG PO TABS: 40.0000 mg | ORAL_TABLET | Freq: Every day | ORAL | 1 refills | Status: DC

## 2023-06-12 MED ORDER — BUPROPION HCL ER (XL) 150 MG PO TB24: 150.0000 mg | ORAL_TABLET | Freq: Every day | ORAL | 1 refills | Status: DC

## 2023-06-12 MED ORDER — SUMATRIPTAN SUCCINATE 100 MG PO TABS
100.0000 mg | ORAL_TABLET | ORAL | 1 refills | Status: AC | PRN
Start: 2023-06-12 — End: ?

## 2023-06-12 MED ORDER — NURTEC 75 MG PO TBDP
1.0000 | ORAL_TABLET | ORAL | 2 refills | Status: DC
Start: 2023-06-12 — End: 2023-12-12

## 2023-06-12 MED ORDER — MONTELUKAST SODIUM 10 MG PO TABS: 10.0000 mg | ORAL_TABLET | Freq: Every evening | ORAL | 1 refills | Status: DC | PRN

## 2023-06-12 MED ORDER — ONDANSETRON HCL 4 MG PO TABS: 4.0000 mg | ORAL_TABLET | Freq: Three times a day (TID) | ORAL | 0 refills | Status: DC | PRN

## 2023-06-12 MED ORDER — TRAZODONE HCL 100 MG PO TABS: 100.0000 mg | ORAL_TABLET | Freq: Every evening | ORAL | 1 refills | Status: DC

## 2023-06-12 MED ORDER — B-12 1000 MCG SL SUBL
1.0000 | SUBLINGUAL_TABLET | SUBLINGUAL | 0 refills | Status: AC
Start: 2023-06-12 — End: ?

## 2023-06-14 ENCOUNTER — Ambulatory Visit: Payer: Commercial Managed Care - PPO | Admitting: Dermatology

## 2023-06-14 ENCOUNTER — Encounter: Payer: Self-pay | Admitting: Dermatology

## 2023-06-14 VITALS — BP 128/79 | HR 62

## 2023-06-14 DIAGNOSIS — L814 Other melanin hyperpigmentation: Secondary | ICD-10-CM | POA: Diagnosis not present

## 2023-06-14 DIAGNOSIS — L821 Other seborrheic keratosis: Secondary | ICD-10-CM | POA: Diagnosis not present

## 2023-06-14 DIAGNOSIS — L578 Other skin changes due to chronic exposure to nonionizing radiation: Secondary | ICD-10-CM

## 2023-06-14 DIAGNOSIS — W908XXA Exposure to other nonionizing radiation, initial encounter: Secondary | ICD-10-CM | POA: Diagnosis not present

## 2023-06-14 NOTE — Patient Instructions (Addendum)
Seborrheic Keratosis  What causes seborrheic keratoses? Seborrheic keratoses are harmless, common skin growths that first appear during adult life.  As time goes by, more growths appear.  Some people may develop a large number of them.  Seborrheic keratoses appear on both covered and uncovered body parts.  They are not caused by sunlight.  The tendency to develop seborrheic keratoses can be inherited.  They vary in color from skin-colored to gray, brown, or even black.  They can be either smooth or have a rough, warty surface.   Seborrheic keratoses are superficial and look as if they were stuck on the skin.  Under the microscope this type of keratosis looks like layers upon layers of skin.  That is why at times the top layer may seem to fall off, but the rest of the growth remains and re-grows.    Treatment Seborrheic keratoses do not need to be treated, but can easily be removed in the office.  Seborrheic keratoses often cause symptoms when they rub on clothing or jewelry.  Lesions can be in the way of shaving.  If they become inflamed, they can cause itching, soreness, or burning.  Removal of a seborrheic keratosis can be accomplished by freezing, burning, or surgery. If any spot bleeds, scabs, or grows rapidly, please return to have it checked, as these can be an indication of a skin cancer.   Due to recent changes in healthcare laws, you may see results of your pathology and/or laboratory studies on MyChart before the doctors have had a chance to review them. We understand that in some cases there may be results that are confusing or concerning to you. Please understand that not all results are received at the same time and often the doctors may need to interpret multiple results in order to provide you with the best plan of care or course of treatment. Therefore, we ask that you please give [place] 2 business days to thoroughly review all your results before contacting the office for clarification.  Should we see a critical lab result, you will be contacted sooner.     Melanoma ABCDEs  Melanoma is the most dangerous type of skin cancer, and is the leading cause of death from skin disease.  You are more likely to develop melanoma if you: Have light-colored skin, light-colored eyes, or red or blond hair Spend a lot of time in the sun Tan regularly, either outdoors or in a tanning bed Have had blistering sunburns, especially during childhood Have a close family member who has had a melanoma Have atypical moles or large birthmarks  Early detection of melanoma is key since treatment is typically straightforward and cure rates are extremely high if we catch it early.   The first sign of melanoma is often a change in a mole or a new dark spot.  The ABCDE system is a way of remembering the signs of melanoma.  A for asymmetry:  The two halves do not match. B for border:  The edges of the growth are irregular. C for color:  A mixture of colors are present instead of an even brown color. D for diameter:  Melanomas are usually (but not always) greater than 6mm - the size of a pencil eraser. E for evolution:  The spot keeps changing in size, shape, and color.  Please check your skin once per month between visits. You can use a small mirror in front and a large mirror behind you to keep an eye on  the back side or your body.   If you see any new or changing lesions before your next follow-up, please call to schedule a visit.  Please continue daily skin protection including broad spectrum sunscreen SPF 30+ to sun-exposed areas, reapplying every 2 hours as needed when you're outdoors.   Staying in the shade or wearing long sleeves, sun glasses (UVA+UVB protection) and wide brim hats (4-inch brim around the entire circumference of the hat) are also recommended for sun protection.     If You Need Anything After Your Visit  If you have any questions or concerns for your doctor, please call our  main line at [phone] and press option 4 to reach your doctor's medical assistant. If no one answers, please leave a voicemail as directed and we will return your call as soon as possible. Messages left after 4 pm will be answered the following business day.   You may also send [place] a message via MyChart. We typically respond to MyChart messages within 1-2 business days.  For prescription refills, please ask your pharmacy to contact our office. Our fax number is [phone].  If you have an urgent issue when the clinic is closed that cannot wait until the next business day, you can page your doctor at the number below.    Please note that while we do our best to be available for urgent issues outside of office hours, we are not available 24/7.   If you have an urgent issue and are unable to reach [place], you may choose to seek medical care at your doctor's office, retail clinic, urgent care center, or emergency room.  If you have a medical emergency, please immediately call 911 or go to the emergency department.  Pager Numbers  - Dr. Gwen Pounds: [phone]  - Dr. Roseanne Reno: [phone]  - Dr. Katrinka Blazing: [phone]   In the event of inclement weather, please call our main line at [phone] for an update on the status of any delays or closures.  Dermatology Medication Tips: Please keep the boxes that topical medications come in in order to help keep track of the instructions about where and how to use these. Pharmacies typically print the medication instructions only on the boxes and not directly on the medication tubes.   If your medication is too expensive, please contact our office at [phone] option 4 or send [place] a message through MyChart.   We are unable to tell what your co-pay for medications will be in advance as this is different depending on your insurance coverage. However, we may be able to find a substitute medication at lower cost or fill out paperwork to get insurance to  cover a needed medication.   If a prior authorization is required to get your medication covered by your insurance company, please allow [place] 1-2 business days to complete this process.  Drug prices often vary depending on where the prescription is filled and some pharmacies may offer cheaper prices.  The website www.goodrx.com contains coupons for medications through different pharmacies. The prices here do not account for what the cost may be with help from insurance (it may be cheaper with your insurance), but the website can give you the price if you did not use any insurance.  - You can print the associated coupon and take it with your prescription to the pharmacy.  - You may also stop by our office during regular business hours and pick up a GoodRx coupon card.  - If you need your  prescription sent electronically to a different pharmacy, notify our office through [facility] or by phone at [phone] option 4.     Si Usted Necesita Algo Despus de Su Visita  Tambin puede enviarnos un mensaje a travs de Clinical cytogeneticist. Por lo general respondemos a los mensajes de MyChart en el transcurso de 1 a 2 das hbiles.  Para renovar recetas, por favor pida a su farmacia que se ponga en contacto con nuestra oficina. Annie Sable de fax es [place] [phone].  Si tiene un asunto urgente cuando la clnica est cerrada y que no puede esperar hasta el siguiente da hbil, puede llamar/localizar a su doctor(a) al nmero que aparece a continuacin.   Por favor, tenga en cuenta que aunque hacemos todo lo posible para estar disponibles para asuntos urgentes fuera del horario de [place], no estamos disponibles las 24 horas del da, los 7 [address]  [address] de la [place].   Si tiene un problema urgente y no puede comunicarse con nosotros, puede optar por buscar atencin mdica  en el consultorio de su doctor(a), en una clnica privada, en un centro de atencin urgente o en una sala de emergencias.  Si tiene Wellsite geologist, por favor llame inmediatamente al 911 o vaya a la sala de emergencias.  Nmeros de bper  - Dr. Gwen Pounds: [phone]  - Dra. Roseanne Reno: [phone]  - Dr. Katrinka Blazing: [phone]   En caso de inclemencias del tiempo, por favor llame a Lacy Duverney principal al [phone] para una actualizacin sobre el [place] de cualquier retraso o cierre.  Consejos para la medicacin en dermatologa: Por favor, guarde las cajas en las que vienen los medicamentos de uso tpico para ayudarle a seguir las instrucciones sobre dnde y cmo usarlos. Las farmacias generalmente imprimen las instrucciones del medicamento slo en las cajas y no directamente en los tubos del [place].   Si su medicamento es muy caro, por favor, pngase en contacto con Rolm Gala llamando al [phone] y presione la opcin 4 o envenos un mensaje a travs de Clinical cytogeneticist.   No podemos decirle cul ser su copago por los medicamentos por adelantado ya que esto es diferente dependiendo de la cobertura de su seguro. Sin embargo, es posible que podamos encontrar un medicamento sustituto a Audiological scientist un formulario para que el seguro cubra el medicamento que se considera necesario.   Si se requiere una autorizacin previa para que su compaa de seguros [place] su medicamento, por favor permtanos de 1 a 2 das hbiles para completar [address]  Los precios de los medicamentos varan con frecuencia dependiendo del Environmental consultant de dnde se surte la receta y alguna farmacias pueden ofrecer precios ms baratos.  El sitio web www.goodrx.com tiene cupones para medicamentos de Health and safety inspector. Los precios aqu no tienen en cuenta lo que podra costar con la ayuda del seguro (puede ser ms barato con su seguro), pero el sitio web puede darle el precio si no utiliz Tourist information centre manager.  - Puede imprimir el cupn correspondiente y llevarlo con su receta a la farmacia.  - Tambin puede pasar por nuestra oficina durante el  horario de atencin regular y Education officer, museum una tarjeta de cupones de GoodRx.  - Si necesita que su receta se enve electrnicamente a una farmacia diferente, informe a nuestra oficina a travs de MyChart de [place] o por telfono llamando al [phone] y presione la opcin 4.

## 2023-06-14 NOTE — Progress Notes (Signed)
   New Patient Visit   Subjective  Ralph Mathews is a 47 y.o. male who presents for the following: patient here today concerning spots he noticed at forehead, temples, cheeks, and b/l arms Patient denies areas itch or bother him Patient denies personal or family history of skin cancer.   The patient has spots, moles and lesions to be evaluated, some may be new or changing and the patient may have concern these could be cancer.   The following portions of the chart were reviewed this encounter and updated as appropriate: medications, allergies, medical history  Review of Systems:  No other skin or systemic complaints except as noted in HPI or Assessment and Plan.  Objective  Well appearing patient in no apparent distress; mood and affect are within normal limits.   A focused examination was performed of the following areas: Face, b/l arms and hands  Relevant exam findings are noted in the Assessment and Plan.    Assessment & Plan    SEBORRHEIC KERATOSIS right temporal scalp, right zygoma, right preauricular, and left upper arm - Stuck-on, waxy, tan-brown papules and/or plaques  - Benign-appearing - Discussed benign etiology and prognosis. - Observe - Call for any changes  LENTIGINES Exam: scattered tan macules on right cheek Due to sun exposure Treatment Plan: Benign-appearing, observe. Recommend daily broad spectrum sunscreen SPF 30+ to sun-exposed areas, reapply every 2 hours as needed.  Call for any changes   ACTINIC DAMAGE - chronic, secondary to cumulative UV radiation exposure/sun exposure over time - diffuse scaly erythematous macules with underlying dyspigmentation - Recommend daily broad spectrum sunscreen SPF 30+ to sun-exposed areas, reapply every 2 hours as needed.  - Recommend staying in the shade or wearing long sleeves, sun glasses (UVA+UVB protection) and wide brim hats (4-inch brim around the entire circumference of the hat). - Call for new or changing  lesions.   No follow-ups on file.  I, Asher Muir, CMA, am acting as scribe for Elie Goody, MD.   Documentation: I have reviewed the above documentation for accuracy and completeness, and I agree with the above.  Elie Goody, MD

## 2023-08-16 ENCOUNTER — Encounter: Payer: Self-pay | Admitting: Family Medicine

## 2023-09-13 DIAGNOSIS — M4316 Spondylolisthesis, lumbar region: Secondary | ICD-10-CM | POA: Diagnosis not present

## 2023-09-13 DIAGNOSIS — M25571 Pain in right ankle and joints of right foot: Secondary | ICD-10-CM | POA: Diagnosis not present

## 2023-09-13 DIAGNOSIS — M40204 Unspecified kyphosis, thoracic region: Secondary | ICD-10-CM | POA: Diagnosis not present

## 2023-09-13 DIAGNOSIS — M9905 Segmental and somatic dysfunction of pelvic region: Secondary | ICD-10-CM | POA: Diagnosis not present

## 2023-09-19 DIAGNOSIS — M9903 Segmental and somatic dysfunction of lumbar region: Secondary | ICD-10-CM | POA: Diagnosis not present

## 2023-09-19 DIAGNOSIS — M9902 Segmental and somatic dysfunction of thoracic region: Secondary | ICD-10-CM | POA: Diagnosis not present

## 2023-09-19 DIAGNOSIS — M9901 Segmental and somatic dysfunction of cervical region: Secondary | ICD-10-CM | POA: Diagnosis not present

## 2023-09-19 DIAGNOSIS — M9905 Segmental and somatic dysfunction of pelvic region: Secondary | ICD-10-CM | POA: Diagnosis not present

## 2023-10-13 ENCOUNTER — Telehealth: Payer: Self-pay

## 2023-10-13 NOTE — Telephone Encounter (Signed)
 Ralph Mathews (Key: [id number]) PA Case ID #: [id number] Rx #: [id number] Need Help? Call us  at [phone] Outcome Approved today by [employer] 2017 [employer]Review Type:Prior Auth;Coverage Start Date:09/13/2023;Coverage End Date:10/12/2024; Authorization Expiration Date: 10/11/2024

## 2023-12-09 ENCOUNTER — Other Ambulatory Visit: Payer: Self-pay | Admitting: Family Medicine

## 2023-12-09 DIAGNOSIS — F339 Major depressive disorder, recurrent, unspecified: Secondary | ICD-10-CM

## 2023-12-09 DIAGNOSIS — J302 Other seasonal allergic rhinitis: Secondary | ICD-10-CM

## 2023-12-12 ENCOUNTER — Encounter: Payer: Self-pay | Admitting: Family Medicine

## 2023-12-12 ENCOUNTER — Ambulatory Visit: Payer: Self-pay | Admitting: Family Medicine

## 2023-12-12 VITALS — BP 110/72 | HR 92 | Resp 16 | Ht 70.0 in | Wt 159.2 lb

## 2023-12-12 DIAGNOSIS — G4700 Insomnia, unspecified: Secondary | ICD-10-CM

## 2023-12-12 DIAGNOSIS — H1013 Acute atopic conjunctivitis, bilateral: Secondary | ICD-10-CM | POA: Diagnosis not present

## 2023-12-12 DIAGNOSIS — J3089 Other allergic rhinitis: Secondary | ICD-10-CM | POA: Diagnosis not present

## 2023-12-12 DIAGNOSIS — G43009 Migraine without aura, not intractable, without status migrainosus: Secondary | ICD-10-CM

## 2023-12-12 DIAGNOSIS — E538 Deficiency of other specified B group vitamins: Secondary | ICD-10-CM | POA: Diagnosis not present

## 2023-12-12 DIAGNOSIS — J302 Other seasonal allergic rhinitis: Secondary | ICD-10-CM

## 2023-12-12 DIAGNOSIS — F339 Major depressive disorder, recurrent, unspecified: Secondary | ICD-10-CM | POA: Diagnosis not present

## 2023-12-12 MED ORDER — CITALOPRAM HYDROBROMIDE 40 MG PO TABS: 40.0000 mg | ORAL_TABLET | Freq: Every day | ORAL | 1 refills | Status: DC

## 2023-12-12 MED ORDER — BUPROPION HCL ER (XL) 150 MG PO TB24: 150.0000 mg | ORAL_TABLET | Freq: Every day | ORAL | 1 refills | Status: DC

## 2023-12-12 MED ORDER — OLOPATADINE HCL 0.1 % OP SOLN: 1.0000 [drp] | Freq: Two times a day (BID) | OPHTHALMIC | 2 refills | Status: AC

## 2023-12-12 MED ORDER — TRAZODONE HCL 100 MG PO TABS: 100.0000 mg | ORAL_TABLET | Freq: Every evening | ORAL | 1 refills | Status: DC

## 2023-12-12 MED ORDER — MONTELUKAST SODIUM 10 MG PO TABS: 10.0000 mg | ORAL_TABLET | Freq: Every evening | ORAL | 1 refills | Status: DC | PRN

## 2023-12-12 MED ORDER — NURTEC 75 MG PO TBDP: 1.0000 | ORAL_TABLET | ORAL | 2 refills | Status: DC

## 2023-12-12 NOTE — Progress Notes (Signed)
 Name: Ralph Mathews   MRN: [id number]    DOB: Dec 15, 1975   Date:12/12/2023       Progress Note  Subjective  Chief Complaint  Chief Complaint  Patient presents with   Medical Management of Chronic Issues   History of back surgery: he had laminectomy back  2015 by Dr. Gerlene Fee, he had some recurrence of radiculitis on right side starting 08/2016 symptoms got progressively worse and had a lumbar fusion L4-L5 by Dr. Lovell Sheehan 09/13/2021, he is doing well now, he states pain is now sporadic, he has intermittent neck pain also . Very seldom has radiculitis    Depression: taking Citalopram for many years and also  added Wellbutrin June 2020.  He changed his job last Fall. 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 He states not very stressful and it has been helpful.    History of kidney stones: went to [facility] [place] back in January 2023 and passed the stone on the right. No episodes since. He has been released from Urologist. Unchanged    Migraine Headaches: he has episodes of left temporal pain , it starts quickly and is very intense.  Pain is  described as starting as a throbbing sensation and sometimes radiates to frontal area, has phonophobia and photophobia, occasionally associated with nausea but no vomiting in a long time. Respond to Imitrex and Nurtec, on average once a month. We will add zofran    Night sweats : symptoms started after back surgery January 2023 , he states goes to bed cold and has one or two blankets and wakes up sweaty. Advised to stop using a blanket , wear socks since legs feet are cold when he goes to bed    Insomnia: he has been taking Trazodone and is able to fall and stay asleep most nights, he goes to bed at 10 pm and has to wake up at 5:30  am.  He  states episodes of vivid dreams are seldom now  and no longer violent.  We referred him to neurologist but he lost to follow up .   Perennial AR: continue medication . Takes singulair and  eye drops, prn antihistamine .Worse this time of the year  B12 deficiency: discussed SL supplementation  Patient Active Problem List   Diagnosis Date Noted   Inguinal hernia of right side without obstruction or gangrene 05/15/2023   Screening for colon cancer 01/16/2023   Spondylolisthesis of lumbar region 09/13/2021   Major depression in remission (HCC) 08/05/2016   History of back surgery 08/05/2016   Migraine without aura and without status migrainosus, not intractable 05/25/2015   Insomnia, persistent 04/11/2015   Chronic constipation 04/11/2015   Lumbosacral spondylosis without myelopathy 04/11/2015   Circadian rhythm sleep disorder, shift work type 04/11/2015   Ejaculates too soon 04/11/2015   Perennial allergic rhinitis with seasonal variation 04/11/2015    Past Surgical History:  Procedure Laterality Date   COLONOSCOPY WITH PROPOFOL N/A 01/16/2023   Procedure: COLONOSCOPY WITH PROPOFOL;  Surgeon: Toney Reil, MD;  Location[facility] ENDOSCOPY;  Service: Gastroenterology;  Laterality: N/A;   LAMINECTOMY  08/14/2014   NASAL SEPTOPLASTY W/ TURBINOPLASTY  09/05/2008   ORIF METATARSAL FRACTURE Right    SPINAL FUSION  09/13/2021    Family History  Problem Relation Age of Onset   Depression Mother    Hypertension Mother    Obesity Mother    Anxiety disorder Father     Social History  Tobacco Use   Smoking status: Never   Smokeless tobacco: Never  Substance Use Topics   Alcohol use: Yes    Alcohol/week: 6.0 standard drinks of alcohol    Types: 4 Cans of beer, 2 Standard drinks or equivalent per week    Comment: 2 mixed drinks and 4 beers a week      Current Outpatient Medications:    buPROPion (WELLBUTRIN XL) 150 MG 24 hr tablet, Take 1 tablet (150 mg total) by mouth daily., Disp: 90 tablet, Rfl: 1   citalopram (CELEXA) 40 MG tablet, Take 1 tablet (40 mg total) by mouth daily., Disp: 90 tablet, Rfl: 1   Cyanocobalamin (B-12) 1000 MCG SUBL, Place 1 tablet  under the tongue 2 (two) times a week., Disp: 30 tablet, Rfl: 0   montelukast (SINGULAIR) 10 MG tablet, Take 1 tablet (10 mg total) by mouth at bedtime as needed (spring and fall allergies)., Disp: 90 tablet, Rfl: 1   olopatadine (PATANOL) 0.1 % ophthalmic solution, Place 1 drop into both eyes 2 (two) times daily., Disp: 5 mL, Rfl: 2   ondansetron (ZOFRAN) 4 MG tablet, Take 1 tablet (4 mg total) by mouth every 8 (eight) hours as needed for nausea or vomiting., Disp: 20 tablet, Rfl: 0   Rimegepant Sulfate (NURTEC) 75 MG TBDP, Take 1 tablet (75 mg total) by mouth every other day., Disp: 16 tablet, Rfl: 2   SUMAtriptan (IMITREX) 100 MG tablet, Take 1 tablet (100 mg total) by mouth every 2 (two) hours as needed for migraine. May repeat in 2 hours if headache persists or recurs., Disp: 9 tablet, Rfl: 1   traZODone (DESYREL) 100 MG tablet, Take 1 tablet (100 mg total) by mouth every evening., Disp: 90 tablet, Rfl: 1  No Known Allergies  I personally reviewed active problem list, medication list, allergies, family history with the patient/caregiver today.   ROS  Constitutional: Negative for fever or weight change.  Respiratory: Negative for cough and shortness of breath.   Cardiovascular: Negative for chest pain or palpitations.  Gastrointestinal: Negative for abdominal pain, no bowel changes.  Musculoskeletal: Negative for gait problem or joint swelling.  Skin: Negative for rash.  Neurological: Negative for dizziness or headache.  No other specific complaints in a complete review of systems (except as listed in HPI above).   Objective Physical Exam Constitutional: Patient appears well-developed and well-nourished.  No distress.  HEENT: head atraumatic, normocephalic, pupils equal and reactive to light,, neck supple Cardiovascular: Normal rate, regular rhythm and normal heart sounds.  No murmur heard. No BLE edema. Pulmonary/Chest: Effort normal and breath sounds normal. No respiratory  distress. Abdominal: Soft.  There is no tenderness. Psychiatric: Patient has a normal mood and affect. behavior is normal. Judgment and thought content normal.   Vitals:   12/12/23 1027  BP: 110/72  Pulse: 92  Resp: 16  SpO2: 100%  Weight: 159 lb 3.2 oz (72.2 kg)  Height: 5\' 10"  (1.778 m)    Body mass index is 22.84 kg/m.  No results found for this or any previous visit (from the past 2160 hours).  Diabetic Foot Exam:     PHQ2/9:    12/12/2023   10:23 AM 06/12/2023   11:31 AM 05/15/2023    2:30 PM 12/05/2022    8:22 AM 06/06/2022    9:07 AM  Depression screen PHQ 2/9  Decreased Interest 0 1 1 1  0  Down, Depressed, Hopeless 0 1 1 1 1   PHQ - 2 Score 0 2 2  2 1  Altered sleeping 0 3 0 3 0  Tired, decreased energy 0 3 1 3 3   Change in appetite 0 0 0 0 0  Feeling bad or failure about yourself  0 0 1 0 0  Trouble concentrating 0 0 0 0 0  Moving slowly or fidgety/restless 0 0 0 0 0  Suicidal thoughts 0 0 0 0 0  PHQ-9 Score 0 8 4 8 4   Difficult doing work/chores Not difficult at all        phq 9 is negative  Fall Risk:    06/12/2023   11:28 AM 05/15/2023    2:30 PM 12/05/2022    8:22 AM 06/06/2022    9:07 AM 11/12/2021    8:02 AM  Fall Risk   Falls in the past year? 0 0 1 1 0  Number falls in past yr: 0 0 1 1 0  Injury with Fall? 0 0 0 0 0  Risk for fall due to : No Fall Risks No Fall Risks No Fall Risks No Fall Risks No Fall Risks  Follow up Falls prevention discussed Falls prevention discussed Falls prevention discussed Falls prevention discussed Falls prevention discussed     Assessment & Plan  1. Major depression, recurrent, chronic (HCC)  - buPROPion (WELLBUTRIN XL) 150 MG 24 hr tablet; Take 1 tablet (150 mg total) by mouth daily.  Dispense: 90 tablet; Refill: 1 - citalopram (CELEXA) 40 MG tablet; Take 1 tablet (40 mg total) by mouth daily.  Dispense: 90 tablet; Refill: 1  2. Perennial allergic rhinitis with seasonal variation  - montelukast (SINGULAIR) 10 MG  tablet; Take 1 tablet (10 mg total) by mouth at bedtime as needed (spring and fall allergies).  Dispense: 90 tablet; Refill: 1  3. Allergic conjunctivitis of both eyes  - olopatadine (PATANOL) 0.1 % ophthalmic solution; Place 1 drop into both eyes 2 (two) times daily.  Dispense: 5 mL; Refill: 2  4. Migraine without aura and without status migrainosus, not intractable  - Rimegepant Sulfate (NURTEC) 75 MG TBDP; Take 1 tablet (75 mg total) by mouth every other day.  Dispense: 16 tablet; Refill: 2  5. Insomnia, persistent  - traZODone (DESYREL) 100 MG tablet; Take 1 tablet (100 mg total) by mouth every evening.  Dispense: 90 tablet; Refill: 1  6. Low serum vitamin B12 (Primary)  Discussed resuming medication

## 2024-01-15 ENCOUNTER — Encounter: Payer: Self-pay | Admitting: Family Medicine

## 2024-01-19 ENCOUNTER — Encounter: Payer: Self-pay | Admitting: Family Medicine

## 2024-01-19 ENCOUNTER — Ambulatory Visit: Admitting: Family Medicine

## 2024-01-19 VITALS — BP 110/60 | HR 97 | Temp 97.8°F | Ht 70.0 in | Wt 158.3 lb

## 2024-01-19 DIAGNOSIS — M5441 Lumbago with sciatica, right side: Secondary | ICD-10-CM

## 2024-01-19 DIAGNOSIS — F419 Anxiety disorder, unspecified: Secondary | ICD-10-CM | POA: Diagnosis not present

## 2024-01-19 DIAGNOSIS — N529 Male erectile dysfunction, unspecified: Secondary | ICD-10-CM | POA: Diagnosis not present

## 2024-01-19 DIAGNOSIS — M5442 Lumbago with sciatica, left side: Secondary | ICD-10-CM | POA: Diagnosis not present

## 2024-01-19 MED ORDER — TADALAFIL 5 MG PO TABS: 5.0000 mg | ORAL_TABLET | Freq: Every day | ORAL | 0 refills | Status: DC

## 2024-01-19 MED ORDER — METHYLPREDNISOLONE 4 MG PO TBPK: ORAL_TABLET | ORAL | 0 refills | Status: DC

## 2024-01-19 NOTE — Progress Notes (Signed)
 Name: Ralph Mathews   MRN: [id number]    DOB: 02-23-1976   Date:01/19/2024       Progress Note  Subjective  Chief Complaint  Chief Complaint  Patient presents with   Sciatica    Sciatic nerve pain bilateral, off and on for months right side worse    testosterone    PT concerned with testosterone levels     Discussed the use of AI scribe software for clinical note transcription with the patient, who gave verbal consent to proceed.  History of Present Illness Ralph Mathews is a 48 year old male who presents with low back pain and erectile dysfunction.  He experiences low back pain that radiates to his buttocks and sometimes down to the back of his knees, primarily on the right side but occasionally on the left or both sides. The pain is described as a 'burning, fiery' sensation and has intensified over the past few weeks, occurring most days. Resting and sitting provide some relief, although sitting is not always effective. He underwent a posterior lumbar antibiotic fusion at L4 and L5 in January 2023, which initially improved his symptoms. However, since his last visit in April, the pain has become more intense. No bowel or bladder incontinence or muscle spasms, only the burning sensation.  He also reports issues with maintaining an erection, first noticed around April. He has a desire for sexual activity but struggles to maintain an erection, although he can reach orgasm.  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 He recently purchased a new car, a Risk [employer], which has caused him some anxiety.    Patient Active Problem List   Diagnosis Date Noted   Inguinal hernia of right side without obstruction or gangrene 05/15/2023   Screening for colon cancer 01/16/2023   Spondylolisthesis of lumbar region 09/13/2021   Major depression in remission (HCC) 08/05/2016   History of back surgery 08/05/2016   Migraine without aura and without  status migrainosus, not intractable 05/25/2015   Insomnia, persistent 04/11/2015   Lumbosacral spondylosis without myelopathy 04/11/2015   Circadian rhythm sleep disorder, shift work type 04/11/2015   Perennial allergic rhinitis with seasonal variation 04/11/2015    Social History   Tobacco Use   Smoking status: Never   Smokeless tobacco: Never  Substance Use Topics   Alcohol use: Yes    Alcohol/week: 6.0 standard drinks of alcohol    Types: 4 Cans of beer, 2 Standard drinks or equivalent per week    Comment: 2 mixed drinks and 4 beers a week      Current Outpatient Medications:    buPROPion  (WELLBUTRIN  XL) 150 MG 24 hr tablet, Take 1 tablet (150 mg total) by mouth daily., Disp: 90 tablet, Rfl: 1   citalopram  (CELEXA ) 40 MG tablet, Take 1 tablet (40 mg total) by mouth daily., Disp: 90 tablet, Rfl: 1   Cyanocobalamin (B-12) 1000 MCG SUBL, Place 1 tablet under the tongue 2 (two) times a week., Disp: 30 tablet, Rfl: 0   montelukast  (SINGULAIR ) 10 MG tablet, Take 1 tablet (10 mg total) by mouth at bedtime as needed (spring and fall allergies)., Disp: 90 tablet, Rfl: 1   olopatadine  (PATANOL) 0.1 % ophthalmic solution, Place 1 drop into both eyes 2 (two) times daily., Disp: 5 mL, Rfl: 2   ondansetron  (ZOFRAN ) 4 MG tablet, Take 1 tablet (4 mg total) by mouth every 8 (eight) hours as needed for nausea or vomiting., Disp: 20 tablet, Rfl:  0   Rimegepant Sulfate (NURTEC) 75 MG TBDP, Take 1 tablet (75 mg total) by mouth every other day., Disp: 16 tablet, Rfl: 2   SUMAtriptan  (IMITREX ) 100 MG tablet, Take 1 tablet (100 mg total) by mouth every 2 (two) hours as needed for migraine. May repeat in 2 hours if headache persists or recurs., Disp: 9 tablet, Rfl: 1   traZODone  (DESYREL ) 100 MG tablet, Take 1 tablet (100 mg total) by mouth every evening., Disp: 90 tablet, Rfl: 1  No Known Allergies  ROS  Ten systems reviewed and is negative except as mentioned in HPI    Objective  Vitals:    01/19/24 1422  BP: 110/60  Pulse: 97  Temp: 97.8 F (36.6 C)  TempSrc: Oral  SpO2: 100%  Weight: 158 lb 4.8 oz (71.8 kg)    Body mass index is 22.71 kg/m.   Physical Exam CONSTITUTIONAL: Patient appears well-developed and well-nourished. No distress. HEENT: Head atraumatic, normocephalic, neck supple. CARDIOVASCULAR: Normal rate, regular rhythm and normal heart sounds. No murmur heard. No BLE edema. PULMONARY: Effort normal and breath sounds normal. No respiratory distress. ABDOMINAL: There is no tenderness or distention. MUSCULOSKELETAL: Normal gait. Without gross motor or sensory deficit. Spine range of motion normal. Mild scoliosis in the upper back on forward flexion. No swelling on the back. PSYCHIATRIC: Patient has a normal mood and affect. Behavior is normal. Judgment and thought content normal.   Assessment & Plan Low back pain with radiculopathy Acute exacerbation on the right side with burning pain radiating to buttocks and down to popliteal fossa  Previous lumbar surgery in January 2023. Inflammation likely contributing. MRI considered if symptoms persist. Discussed Medrol  Dosepak, Lyrica, or gabapentin . Further evaluation by Dr. Larrie Po and possible contrast CT angiography  due to surgery history. - Prescribe Medrol  Dosepak (prednisone  taper) to reduce inflammation. - Consider Lyrica or gabapentin  if symptoms persist after prednisone . - Refer to Dr. Larrie Po for further evaluation if symptoms continue. - Order contrast CT if MRI is contraindicated due to previous surgery.  Erectile dysfunction Difficulty maintaining erection since April, possibly due to low back pain and radiculopathy. Discussed psychological factors and nerve pain. Prescribed Cialis 5 mg daily. Discussed cost-effective purchasing options and insurance coverage. - Prescribe Cialis 5 mg daily to normalize erectile function. - Advise use of GoodRx for cost-effective purchasing options. - Discuss potential  insurance coverage issues and alternative payment options.  Situational anxiety Anxiety related to driving a new car, situational and likely to pass. No history of anxiety with previous vehicles. Discussed non-medication strategies. - Advise cognitive strategies to manage anxiety, such as focusing on gratitude and safety features of the car. - Reassure that situational anxiety is likely to resolve over time.

## 2024-02-22 DIAGNOSIS — H40013 Open angle with borderline findings, low risk, bilateral: Secondary | ICD-10-CM | POA: Diagnosis not present

## 2024-02-22 DIAGNOSIS — H5213 Myopia, bilateral: Secondary | ICD-10-CM | POA: Diagnosis not present

## 2024-02-22 DIAGNOSIS — H1045 Other chronic allergic conjunctivitis: Secondary | ICD-10-CM | POA: Diagnosis not present

## 2024-02-22 DIAGNOSIS — H52223 Regular astigmatism, bilateral: Secondary | ICD-10-CM | POA: Diagnosis not present

## 2024-05-17 NOTE — Patient Instructions (Signed)

## 2024-05-20 ENCOUNTER — Ambulatory Visit: Admitting: Family Medicine

## 2024-05-20 ENCOUNTER — Encounter: Payer: Self-pay | Admitting: Family Medicine

## 2024-05-20 VITALS — BP 112/76 | HR 74 | Resp 16 | Ht 70.0 in | Wt 158.8 lb

## 2024-05-20 DIAGNOSIS — E538 Deficiency of other specified B group vitamins: Secondary | ICD-10-CM | POA: Diagnosis not present

## 2024-05-20 DIAGNOSIS — Z131 Encounter for screening for diabetes mellitus: Secondary | ICD-10-CM | POA: Diagnosis not present

## 2024-05-20 DIAGNOSIS — Z0000 Encounter for general adult medical examination without abnormal findings: Secondary | ICD-10-CM

## 2024-05-20 DIAGNOSIS — Z23 Encounter for immunization: Secondary | ICD-10-CM | POA: Diagnosis not present

## 2024-05-20 DIAGNOSIS — Z0001 Encounter for general adult medical examination with abnormal findings: Secondary | ICD-10-CM | POA: Diagnosis not present

## 2024-05-20 DIAGNOSIS — Z13220 Encounter for screening for lipoid disorders: Secondary | ICD-10-CM | POA: Diagnosis not present

## 2024-05-20 DIAGNOSIS — Z1159 Encounter for screening for other viral diseases: Secondary | ICD-10-CM

## 2024-05-20 DIAGNOSIS — Z79899 Other long term (current) drug therapy: Secondary | ICD-10-CM

## 2024-05-20 NOTE — Progress Notes (Addendum)
 Name: Ralph Mathews   MRN: [id number]    DOB: [birth year]   Date:05/20/2024       Progress Note  Subjective  Chief Complaint  Chief Complaint  Patient presents with   Annual Exam    HPI  Patient presents for annual CPE .   IPSS     Row Name 05/20/24 0933         International Prostate Symptom Score   How often have you had the sensation of not emptying your bladder? Not at All     How often have you had to urinate less than every two hours? Not at All     How often have you found you stopped and started again several times when you urinated? Not at All     How often have you found it difficult to postpone urination? Not at All     How often have you had a weak urinary stream? Not at All     How often have you had to strain to start urination? Not at All     How many times did you typically get up at night to urinate? 1 Time     Total IPSS Score 1       Quality of Life due to urinary symptoms   If you were to spend the rest of your life with your urinary condition just the way it is now how would you feel about that? Pleased        Diet: cooks at home most of time Exercise: he is having back pain but still physically active  Last Dental Exam: up to date Last Eye Exam: up to date   Depression: phq 9 is negative    05/20/2024    9:32 AM 01/19/2024    2:25 PM 12/12/2023   10:23 AM 06/12/2023   11:31 AM 05/15/2023    2:30 PM  Depression screen PHQ 2/9  Decreased Interest 0 0 0 1 1  Down, Depressed, Hopeless 0 0 0 1 1  PHQ - 2 Score 0 0 0 2 2  Altered sleeping 0 0 0 3 0  Tired, decreased energy 0 0 0 3 1  Change in appetite 0 0 0 0 0  Feeling bad or failure about yourself  0 0 0 0 1  Trouble concentrating 0 0 0 0 0  Moving slowly or fidgety/restless 0 0 0 0 0  Suicidal thoughts 0 0 0 0 0  PHQ-9 Score 0 0 0 8 4  Difficult doing work/chores Not difficult at all Not difficult at all Not difficult at all      Hypertension:  BP Readings from Last 3 Encounters:  05/20/24  112/76  01/19/24 110/60  12/12/23 110/72    Obesity: Wt Readings from Last 3 Encounters:  05/20/24 158 lb 12.8 oz (72 kg)  01/19/24 158 lb 4.8 oz (71.8 kg)  12/12/23 159 lb 3.2 oz (72.2 kg)   BMI Readings from Last 3 Encounters:  05/20/24 22.79 kg/m  01/19/24 22.71 kg/m  12/12/23 22.84 kg/m     Flowsheet Row Office Visit from 05/20/2024 in [facility]  AUDIT-C Score 5      Married STD testing and prevention (HIV/chl/gon/syphilis):  not applicable Sexual history: no problems  Hep C Screening: completed Skin cancer: Discussed monitoring for atypical lesions Colorectal cancer: repeat in 2034 Prostate cancer:  not applicable Lab Results  Component Value Date   PSA 0.37 11/11/2020     Lung cancer:  Low Dose CT Chest recommended if Age 87-80 years, 30 pack-year currently smoking OR have quit w/in 15years. Patient  is not a candidate for screening   AAA: The USPSTF recommends one-time screening with ultrasonography in men ages 67 to 75 years who have ever smoked. Patient   is not a candidate for screening  ECG:  2018  Vaccines: reviewed with the patient.   Advanced Care Planning: A voluntary discussion about advance care planning including the explanation and discussion of advance directives.  Discussed health care proxy and Living will, and the patient was able to identify a health care proxy as wife.  Patient does not have a living will and power of attorney of health care   Patient Active Problem List   Diagnosis Date Noted   Inguinal hernia of right side without obstruction or gangrene 05/15/2023   Spondylolisthesis of lumbar region 09/13/2021   Major depression in remission (HCC) 08/05/2016   History of back surgery 08/05/2016   Migraine without aura and without status migrainosus, not intractable 05/25/2015   Insomnia, persistent 04/11/2015   Lumbosacral spondylosis without myelopathy 04/11/2015   Circadian rhythm sleep disorder, shift  work type 04/11/2015   Perennial allergic rhinitis with seasonal variation 04/11/2015    Past Surgical History:  Procedure Laterality Date   COLONOSCOPY WITH PROPOFOL  N/A 01/16/2023   Procedure: COLONOSCOPY WITH PROPOFOL ;  Surgeon: Unk Corinn Skiff, MD;  Location: [facility] ENDOSCOPY;  Service: Gastroenterology;  Laterality: N/A;   FRACTURE SURGERY     LAMINECTOMY  08/14/2014   NASAL SEPTOPLASTY W/ TURBINOPLASTY  09/05/2008   ORIF METATARSAL FRACTURE Right    SPINAL FUSION  09/13/2021    Family History  Problem Relation Age of Onset   Depression Mother    Hypertension Mother    Obesity Mother    Anxiety disorder Father     Social History   Socioeconomic History   Marital status: Married    Spouse name: Alan   Number of children: 4   Years of education: 17   Highest education level: Bachelor's degree (e.g., BA, AB, BS)  Occupational History   Occupation: cook   Tobacco Use   Smoking status: Never   Smokeless tobacco: Never  Vaping Use   Vaping status: Never Used  Substance and Sexual Activity   Alcohol use: Yes    Alcohol/week: 6.0 standard drinks of alcohol    Types: 4 Cans of beer, 2 Standard drinks or equivalent per week    Comment: 2 mixed drinks and 4 beers a week    Drug use: No    Comment: experimented with marijuana as a teenager   Sexual activity: Yes    Partners: Female    Birth control/protection: Other-see comments    Comment: wife had an ablation  Other Topics Concern   Not on file  Social History Narrative   Married, has 4 daughters, mother-in-law helps them out ( taking care of children and financially )    Social Drivers of Corporate investment banker Strain: Low Risk  (05/19/2024)   Overall Financial Resource Strain (CARDIA)    Difficulty of Paying Living Expenses: Not very hard  Food Insecurity: No Food Insecurity (05/19/2024)   Hunger Vital Sign    Worried About Running Out of Food in the Last Year: Never true    Ran Out of Food in the  Last Year: Never true  Transportation Needs: No Transportation Needs (05/19/2024)   PRAPARE - Administrator, Civil Service (Medical): No  Lack of Transportation (Non-Medical): No  Physical Activity: Sufficiently Active (05/19/2024)   Exercise Vital Sign    Days of Exercise per Week: 7 days    Minutes of Exercise per Session: 90 min  Stress: Stress Concern Present (05/19/2024)   [employer] of Occupational Health - Occupational Stress Questionnaire    Feeling of Stress: To some extent  Social Connections: Moderately Integrated (05/19/2024)   Social Connection and Isolation Panel    Frequency of Communication with Friends and Family: Three times a week    Frequency of Social Gatherings with Friends and Family: Never    Attends Religious Services: More than 4 times per year    Active Member of [employer] or Organizations: No    Attends Engineer, structural: Not on file    Marital Status: Married  Catering manager Violence: Not At Risk (05/20/2024)   Humiliation, Afraid, Rape, and Kick questionnaire    Fear of Current or Ex-Partner: No    Emotionally Abused: No    Physically Abused: No    Sexually Abused: No     Current Outpatient Medications:    buPROPion  (WELLBUTRIN  XL) 150 MG 24 hr tablet, Take 1 tablet (150 mg total) by mouth daily., Disp: 90 tablet, Rfl: 1   citalopram  (CELEXA ) 40 MG tablet, Take 1 tablet (40 mg total) by mouth daily., Disp: 90 tablet, Rfl: 1   Cyanocobalamin (B-12) 1000 MCG SUBL, Place 1 tablet under the tongue 2 (two) times a week., Disp: 30 tablet, Rfl: 0   methylPREDNISolone  (MEDROL  DOSEPAK) 4 MG TBPK tablet, Take as directed, Disp: 21 tablet, Rfl: 0   montelukast  (SINGULAIR ) 10 MG tablet, Take 1 tablet (10 mg total) by mouth at bedtime as needed (spring and fall allergies)., Disp: 90 tablet, Rfl: 1   olopatadine  (PATANOL) 0.1 % ophthalmic solution, Place 1 drop into both eyes 2 (two) times daily., Disp: 5 mL, Rfl: 2   ondansetron  (ZOFRAN )  4 MG tablet, Take 1 tablet (4 mg total) by mouth every 8 (eight) hours as needed for nausea or vomiting., Disp: 20 tablet, Rfl: 0   Rimegepant Sulfate (NURTEC) 75 MG TBDP, Take 1 tablet (75 mg total) by mouth every other day., Disp: 16 tablet, Rfl: 2   SUMAtriptan  (IMITREX ) 100 MG tablet, Take 1 tablet (100 mg total) by mouth every 2 (two) hours as needed for migraine. May repeat in 2 hours if headache persists or recurs., Disp: 9 tablet, Rfl: 1   tadalafil  (CIALIS ) 5 MG tablet, Take 1 tablet (5 mg total) by mouth daily., Disp: 30 tablet, Rfl: 0   traZODone  (DESYREL ) 100 MG tablet, Take 1 tablet (100 mg total) by mouth every evening., Disp: 90 tablet, Rfl: 1  No Known Allergies   ROS  Constitutional: Negative for fever or weight change.  Respiratory: Negative for cough and shortness of breath.   Scratch throat since last Wednesday, wife had COVId Cardiovascular: Negative for chest pain or palpitations.  Gastrointestinal: Negative for abdominal pain, no bowel changes.  Musculoskeletal: positive for intermittent back pain  Skin: Negative for rash.  Neurological: Negative for dizziness or headache.  No other specific complaints in a complete review of systems (except as listed in HPI above).    Objective  Vitals:   05/20/24 0935  BP: 112/76  Pulse: 74  Resp: 16  SpO2: 97%  Weight: 158 lb 12.8 oz (72 kg)  Height: 5' 10 (1.778 m)    Body mass index is 22.79 kg/m.  Physical Exam  Constitutional: Patient  appears well-developed and well-nourished. No distress.  HENT: Head: Normocephalic and atraumatic. Ears: B TMs ok, no erythema or effusion; Nose: Nose normal. Mouth/Throat: Oropharynx is clear and moist. No oropharyngeal exudate.  Eyes: Conjunctivae and EOM are normal. Pupils are equal, round, and reactive to light. No scleral icterus.  Neck: Normal range of motion. Neck supple. No JVD present. No thyromegaly present.  Cardiovascular: Normal rate, regular rhythm and normal heart  sounds.  No murmur heard. No BLE edema. Pulmonary/Chest: Effort normal and breath sounds normal. No respiratory distress. Abdominal: Soft. Bowel sounds are normal, no distension. There is no tenderness. no masses, possibly early inguinal hernia  MALE GENITALIA: normal exam RECTAL:not done  Musculoskeletal: Normal range of motion, no joint effusions. No gross deformities Neurological: he is alert and oriented to person, place, and time. No cranial nerve deficit. Coordination, balance, strength, speech and gait are normal.  Skin: Skin is warm and dry. No rash noted. No erythema.  Psychiatric: Patient has a normal mood and affect. behavior is normal. Judgment and thought content normal.     Assessment & Plan   1. Well adult exam (Primary)  - Lipid panel - CBC with Differential/Platelet - Comprehensive metabolic panel with GFR - Hemoglobin A1c - Hepatitis B surface antibody,qualitative - B12 and Folate Panel  2. Low serum vitamin B12  - CBC with Differential/Platelet - B12 and Folate Panel  3. Lipid screening  - Lipid panel  4. Screening for diabetes mellitus  - Hemoglobin A1c  5. Need for hepatitis B screening test  - Hepatitis B surface antibody,qualitative  6. Need for influenza vaccination  - Flu vaccine trivalent PF, 6mos and older(Flulaval,Afluria,Fluarix,Fluzone)    -Prostate cancer screening and PSA options (with potential risks and benefits of testing vs not testing) were discussed along with recent recs/guidelines. -USPSTF grade A and B recommendations reviewed with patient; age-appropriate recommendations, preventive care, screening tests, etc discussed and encouraged; healthy living encouraged; see AVS for patient education given to patient -Discussed importance of 150 minutes of physical activity weekly, eat two servings of fish weekly, eat one serving of tree nuts ( cashews, pistachios, pecans, almonds.SABRA) every other day, eat 6 servings of fruit/vegetables  daily and drink plenty of water  and avoid sweet beverages.  -Reviewed Health Maintenance: yes

## 2024-05-21 ENCOUNTER — Ambulatory Visit: Payer: Self-pay | Admitting: Family Medicine

## 2024-05-21 LAB — COMPREHENSIVE METABOLIC PANEL WITH GFR
AG Ratio: 2 (calc) (ref 1.0–2.5)
ALT: 16 U/L (ref 9–46)
AST: 17 U/L (ref 10–40)
Albumin: 4.3 g/dL (ref 3.6–5.1)
Alkaline phosphatase (APISO): 62 U/L (ref 36–130)
BUN: 16 mg/dL (ref 7–25)
CO2: 30 mmol/L (ref 20–32)
Calcium: 9.4 mg/dL (ref 8.6–10.3)
Chloride: 105 mmol/L (ref 98–110)
Creat: 0.87 mg/dL (ref 0.60–1.29)
Globulin: 2.2 g/dL (ref 1.9–3.7)
Glucose, Bld: 91 mg/dL (ref 65–99)
Potassium: 4.7 mmol/L (ref 3.5–5.3)
Sodium: 141 mmol/L (ref 135–146)
Total Bilirubin: 0.4 mg/dL (ref 0.2–1.2)
Total Protein: 6.5 g/dL (ref 6.1–8.1)
eGFR: 107 mL/min/1.73m2 (ref >=60)

## 2024-05-21 LAB — CBC WITH DIFFERENTIAL/PLATELET
Absolute Lymphocytes: 1278 {cells}/uL (ref 850–3900)
Absolute Monocytes: 509 {cells}/uL (ref 200–950)
Basophils Absolute: 32 {cells}/uL (ref 0–200)
Basophils Relative: 0.7 %
Eosinophils Absolute: 90 {cells}/uL (ref 15–500)
Eosinophils Relative: 2 %
HCT: 48.2 % (ref 38.5–50.0)
Hemoglobin: 15.6 g/dL (ref 13.2–17.1)
MCH: 31 pg (ref 27.0–33.0)
MCHC: 32.4 g/dL (ref 32.0–36.0)
MCV: 95.6 fL (ref 80.0–100.0)
MPV: 9.5 fL (ref 7.5–12.5)
Monocytes Relative: 11.3 %
Neutro Abs: 2592 {cells}/uL (ref 1500–7800)
Neutrophils Relative %: 57.6 %
Platelets: 236 Thousand/uL (ref 140–400)
RBC: 5.04 Million/uL (ref 4.20–5.80)
RDW: 13 % (ref 11.0–15.0)
Total Lymphocyte: 28.4 %
WBC: 4.5 Thousand/uL (ref 3.8–10.8)

## 2024-05-21 LAB — HEMOGLOBIN A1C
Hgb A1c MFr Bld: 5.3 % (ref <5.7)
Mean Plasma Glucose: 105 mg/dL
eAG (mmol/L): 5.8 mmol/L

## 2024-05-21 LAB — LIPID PANEL
Cholesterol: 163 mg/dL (ref <200)
HDL: 70 mg/dL (ref >=40)
LDL Cholesterol (Calc): 79 mg/dL
Non-HDL Cholesterol (Calc): 93 mg/dL (ref <130)
Total CHOL/HDL Ratio: 2.3 (calc) (ref <5.0)
Triglycerides: 64 mg/dL (ref <150)

## 2024-05-21 LAB — HEPATITIS B SURFACE ANTIBODY,QUALITATIVE: Hep B S Ab: NONREACTIVE

## 2024-05-21 LAB — B12 AND FOLATE PANEL
Folate: 16.8 ng/mL
Vitamin B-12: 319 pg/mL (ref 200–1100)

## 2024-06-12 ENCOUNTER — Encounter: Payer: Self-pay | Admitting: Family Medicine

## 2024-06-12 ENCOUNTER — Ambulatory Visit: Admitting: Family Medicine

## 2024-06-12 VITALS — BP 116/66 | HR 89 | Temp 97.8°F | Resp 18 | Ht 70.0 in | Wt 159.4 lb

## 2024-06-12 DIAGNOSIS — G4700 Insomnia, unspecified: Secondary | ICD-10-CM | POA: Diagnosis not present

## 2024-06-12 DIAGNOSIS — E538 Deficiency of other specified B group vitamins: Secondary | ICD-10-CM

## 2024-06-12 DIAGNOSIS — J3089 Other allergic rhinitis: Secondary | ICD-10-CM

## 2024-06-12 DIAGNOSIS — M5442 Lumbago with sciatica, left side: Secondary | ICD-10-CM

## 2024-06-12 DIAGNOSIS — G8929 Other chronic pain: Secondary | ICD-10-CM

## 2024-06-12 DIAGNOSIS — Z23 Encounter for immunization: Secondary | ICD-10-CM

## 2024-06-12 DIAGNOSIS — F339 Major depressive disorder, recurrent, unspecified: Secondary | ICD-10-CM

## 2024-06-12 DIAGNOSIS — L821 Other seborrheic keratosis: Secondary | ICD-10-CM

## 2024-06-12 DIAGNOSIS — M5441 Lumbago with sciatica, right side: Secondary | ICD-10-CM

## 2024-06-12 DIAGNOSIS — G43009 Migraine without aura, not intractable, without status migrainosus: Secondary | ICD-10-CM

## 2024-06-12 DIAGNOSIS — J302 Other seasonal allergic rhinitis: Secondary | ICD-10-CM

## 2024-06-12 MED ORDER — LEVOCETIRIZINE DIHYDROCHLORIDE 5 MG PO TABS: 5.0000 mg | ORAL_TABLET | Freq: Every evening | ORAL | 1 refills | Status: AC

## 2024-06-12 MED ORDER — CITALOPRAM HYDROBROMIDE 40 MG PO TABS: 40.0000 mg | ORAL_TABLET | Freq: Every day | ORAL | 1 refills | Status: AC

## 2024-06-12 MED ORDER — MONTELUKAST SODIUM 10 MG PO TABS: 10.0000 mg | ORAL_TABLET | Freq: Every evening | ORAL | 1 refills | Status: AC | PRN

## 2024-06-12 MED ORDER — BUPROPION HCL ER (XL) 150 MG PO TB24: 150.0000 mg | ORAL_TABLET | Freq: Every day | ORAL | 1 refills | Status: AC

## 2024-06-12 MED ORDER — NURTEC 75 MG PO TBDP: 1.0000 | ORAL_TABLET | ORAL | 2 refills | Status: AC

## 2024-06-12 MED ORDER — TRAZODONE HCL 100 MG PO TABS: 100.0000 mg | ORAL_TABLET | Freq: Every evening | ORAL | 1 refills | Status: AC

## 2024-06-12 NOTE — Patient Instructions (Addendum)
 Picture houses in neighborhood and the people inside and say : May be happy, may you be safe , may you be healthy  It is called love and kindness meditation

## 2024-06-12 NOTE — Progress Notes (Signed)
 Name: Ralph Mathews   MRN: [id number]    DOB: 06/07/1976   Date:06/12/2024       Progress Note  Subjective  Chief Complaint  Chief Complaint  Patient presents with   Medical Management of Chronic Issues    6 month   Depression   Insomnia   Migraine   Nevus    Right side of face temple area   Medication Refill   Discussed the use of AI scribe software for clinical note transcription with the patient, who gave verbal consent to proceed.  History of Present Illness Ralph Mathews is a 48 year old male with migraines and insomnia who presents for a six-month follow-up.  He has a spot on the temporal area of his face.  He experiences migraines a couple of times a month, primarily using Nurtec for treatment, which is effective and allows him to return to work within an hour. He no longer uses Imitrex . No nausea is associated with his migraines, but he does have some light and sound sensitivity. He typically carries Nurtec with him to manage symptoms promptly.  He has persistent insomnia and uses trazodone  100 mg to help him fall and stay asleep. He feels groggy during the day, which he attributes to his irregular work schedule rather than the medication.  He has perennial allergic rhinitis with seasonal variation and takes montelukast  nightly. He occasionally uses eye drops, primarily in the spring, and takes Claritin over the counter. He has not tried nasal sprays or Xyzal.  He has a history of major depression, recurrent and chronic, and takes citalopram  40 mg and Wellbutrin  150 mg. He experiences racing thoughts at bedtime, which he attributes to general worries.  He takes a B12 supplement over the counter. His B12 levels have decreased slightly over the past year, from 338 to 319, with a previous level of 540 seven years ago.  He reports occasional back pain, primarily affecting his hips and legs, which he attributes to radiculitis. He manages this with occasional use of ibuprofen or  Aleve, avoiding daily use to prevent stomach and kidney issues.    Patient Active Problem List   Diagnosis Date Noted   Inguinal hernia of right side without obstruction or gangrene 05/15/2023   Spondylolisthesis of lumbar region 09/13/2021   Major depression in remission 08/05/2016   History of back surgery 08/05/2016   Migraine without aura and without status migrainosus, not intractable 05/25/2015   Insomnia, persistent 04/11/2015   Lumbosacral spondylosis without myelopathy 04/11/2015   Circadian rhythm sleep disorder, shift work type 04/11/2015   Perennial allergic rhinitis with seasonal variation 04/11/2015    Past Surgical History:  Procedure Laterality Date   COLONOSCOPY WITH PROPOFOL  N/A 01/16/2023   Procedure: COLONOSCOPY WITH PROPOFOL ;  Surgeon: Unk Corinn Skiff, MD;  Location: [facility] ENDOSCOPY;  Service: Gastroenterology;  Laterality: N/A;   FRACTURE SURGERY     LAMINECTOMY  08/14/2014   NASAL SEPTOPLASTY W/ TURBINOPLASTY  09/05/2008   ORIF METATARSAL FRACTURE Right    SPINAL FUSION  09/13/2021    Family History  Problem Relation Age of Onset   Depression Mother    Hypertension Mother    Obesity Mother    Anxiety disorder Father     Social History   Tobacco Use   Smoking status: Never   Smokeless tobacco: Never  Substance Use Topics   Alcohol use: Yes    Alcohol/week: 6.0 standard drinks of alcohol    Types: 4 Cans of beer, 2 Standard drinks or  equivalent per week    Comment: 2 mixed drinks and 4 beers a week      Current Outpatient Medications:    buPROPion  (WELLBUTRIN  XL) 150 MG 24 hr tablet, Take 1 tablet (150 mg total) by mouth daily., Disp: 90 tablet, Rfl: 1   citalopram  (CELEXA ) 40 MG tablet, Take 1 tablet (40 mg total) by mouth daily., Disp: 90 tablet, Rfl: 1   Cyanocobalamin (B-12) 1000 MCG SUBL, Place 1 tablet under the tongue 2 (two) times a week., Disp: 30 tablet, Rfl: 0   montelukast  (SINGULAIR ) 10 MG tablet, Take 1 tablet (10 mg total) by  mouth at bedtime as needed (spring and fall allergies)., Disp: 90 tablet, Rfl: 1   olopatadine  (PATANOL) 0.1 % ophthalmic solution, Place 1 drop into both eyes 2 (two) times daily., Disp: 5 mL, Rfl: 2   ondansetron  (ZOFRAN ) 4 MG tablet, Take 1 tablet (4 mg total) by mouth every 8 (eight) hours as needed for nausea or vomiting., Disp: 20 tablet, Rfl: 0   Rimegepant Sulfate (NURTEC) 75 MG TBDP, Take 1 tablet (75 mg total) by mouth every other day., Disp: 16 tablet, Rfl: 2   SUMAtriptan  (IMITREX ) 100 MG tablet, Take 1 tablet (100 mg total) by mouth every 2 (two) hours as needed for migraine. May repeat in 2 hours if headache persists or recurs., Disp: 9 tablet, Rfl: 1   traZODone  (DESYREL ) 100 MG tablet, Take 1 tablet (100 mg total) by mouth every evening., Disp: 90 tablet, Rfl: 1  No Known Allergies  I personally reviewed active problem list, medication list, allergies, family history with the patient/caregiver today.   ROS  Ten systems reviewed and is negative except as mentioned in HPI    Objective Physical Exam VITALS: BP- 116/66 MEASUREMENTS: Weight- same. CONSTITUTIONAL: Patient appears well-developed and well-nourished. No distress. HEENT: Head atraumatic, normocephalic, neck supple. CARDIOVASCULAR: Normal rate, regular rhythm and normal heart sounds. No murmur heard. No BLE edema. PULMONARY: Effort normal and breath sounds normal. No respiratory distress. ABDOMINAL: There is no tenderness or distention. MUSCULOSKELETAL: Normal gait. Without gross motor or sensory deficit. PSYCHIATRIC: Patient has a normal mood and affect. Behavior is normal. Judgment and thought content normal. SKIN: Seborrheic keratosis on the face.    Vitals:   06/12/24 1025  BP: 116/66  Pulse: 89  Resp: 18  Temp: 97.8 F (36.6 C)  SpO2: 99%  Weight: 159 lb 6.4 oz (72.3 kg)  Height: 5' 10 (1.778 m)    Body mass index is 22.87 kg/m.  Recent Results (from the past 2160 hours)  Lipid panel      Status: None   Collection Time: 05/20/24 10:52 AM  Result Value Ref Range   Cholesterol 163 <200 mg/dL   HDL 70 > OR = 40 mg/dL   Triglycerides 64 <849 mg/dL   LDL Cholesterol (Calc) 79 mg/dL (calc)    Comment: Reference range: <100 . Desirable range <100 mg/dL for primary prevention;   <70 mg/dL for patients with CHD or diabetic patients  with > or = 2 CHD risk factors. SABRA LDL-C is now calculated using the Martin-Hopkins  calculation, which is a validated novel method providing  better accuracy than the Friedewald equation in the  estimation of LDL-C.  Gladis APPLETHWAITE et al. SANDREA. 7986;689(80): 2061-2068  (http://education.[facility].com/faq/FAQ164)    Total CHOL/HDL Ratio 2.3 <5.0 (calc)   Non-HDL Cholesterol (Calc) 93 <869 mg/dL (calc)    Comment: For patients with diabetes plus 1 major ASCVD risk  factor, treating to  a non-HDL-C goal of <100 mg/dL  (LDL-C of <29 mg/dL) is considered a therapeutic  option.   CBC with Differential/Platelet     Status: None   Collection Time: 05/20/24 10:52 AM  Result Value Ref Range   WBC 4.5 3.8 - 10.8 Thousand/uL   RBC 5.04 4.20 - 5.80 Million/uL   Hemoglobin 15.6 13.2 - 17.1 g/dL   HCT 51.7 61.4 - 49.9 %   MCV 95.6 80.0 - 100.0 fL   MCH 31.0 27.0 - 33.0 pg   MCHC 32.4 32.0 - 36.0 g/dL    Comment: For adults, a slight decrease in the calculated MCHC value (in the range of 30 to 32 g/dL) is most likely not clinically significant; however, it should be interpreted with caution in correlation with other red cell parameters and the patient's clinical condition.    RDW 13.0 11.0 - 15.0 %   Platelets 236 140 - 400 Thousand/uL   MPV 9.5 7.5 - 12.5 fL   Neutro Abs 2,592 1,500 - 7,800 cells/uL   Absolute Lymphocytes 1,278 850 - 3,900 cells/uL   Absolute Monocytes 509 200 - 950 cells/uL   Eosinophils Absolute 90 15 - 500 cells/uL   Basophils Absolute 32 0 - 200 cells/uL   Neutrophils Relative % 57.6 %   Total Lymphocyte 28.4 %    Monocytes Relative 11.3 %   Eosinophils Relative 2.0 %   Basophils Relative 0.7 %  Comprehensive metabolic panel with GFR     Status: None   Collection Time: 05/20/24 10:52 AM  Result Value Ref Range   Glucose, Bld 91 65 - 99 mg/dL    Comment: .            Fasting reference interval .    BUN 16 7 - 25 mg/dL   Creat 9.12 9.39 - 8.70 mg/dL   eGFR 892 > OR = 60 fO/fpw/8.26f7   BUN/Creatinine Ratio SEE NOTE: 6 - 22 (calc)    Comment:    Not Reported: BUN and Creatinine are within    reference range. .    Sodium 141 135 - 146 mmol/L   Potassium 4.7 3.5 - 5.3 mmol/L   Chloride 105 98 - 110 mmol/L   CO2 30 20 - 32 mmol/L   Calcium 9.4 8.6 - 10.3 mg/dL   Total Protein 6.5 6.1 - 8.1 g/dL   Albumin 4.3 3.6 - 5.1 g/dL   Globulin 2.2 1.9 - 3.7 g/dL (calc)   AG Ratio 2.0 1.0 - 2.5 (calc)   Total Bilirubin 0.4 0.2 - 1.2 mg/dL   Alkaline phosphatase (APISO) 62 36 - 130 U/L   AST 17 10 - 40 U/L   ALT 16 9 - 46 U/L  Hemoglobin A1c     Status: None   Collection Time: 05/20/24 10:52 AM  Result Value Ref Range   Hgb A1c MFr Bld 5.3 <5.7 %    Comment: For the purpose of screening for the presence of diabetes: . <5.7%       Consistent with the absence of diabetes 5.7-6.4%    Consistent with increased risk for diabetes             (prediabetes) > or =6.5%  Consistent with diabetes . This assay result is consistent with a decreased risk of diabetes. . Currently, no consensus exists regarding use of hemoglobin A1c for diagnosis of diabetes in children. . According to American Diabetes Association (ADA) guidelines, hemoglobin A1c <7.0% represents optimal control in non-pregnant diabetic patients. Different  metrics may apply to specific patient populations.  Standards of Medical Care in Diabetes(ADA). .    Mean Plasma Glucose 105 mg/dL   eAG (mmol/L) 5.8 mmol/L  Hepatitis B surface antibody,qualitative     Status: None   Collection Time: 05/20/24 10:52 AM  Result Value Ref Range    Hep B S Ab NON-REACTIVE NON-REACTIVE  B12 and Folate Panel     Status: None   Collection Time: 05/20/24 10:52 AM  Result Value Ref Range   Vitamin B-12 319 200 - 1,100 pg/mL    Comment: . Please Note: Although the reference range for vitamin B12 is [phone] pg/mL, it has been reported that between 5 and 10% of patients with values between 200 and 400 pg/mL may experience neuropsychiatric and hematologic abnormalities due to occult B12 deficiency; less than 1% of patients with values above 400 pg/mL will have symptoms. .    Folate 16.8 ng/mL    Comment:                            Reference Range                            Low:           <3.4                            Borderline:    3.4-5.4                            Normal:        >5.4 .     Diabetic Foot Exam:     PHQ2/9:    06/12/2024   10:29 AM 05/20/2024    9:32 AM 01/19/2024    2:25 PM 12/12/2023   10:23 AM 06/12/2023   11:31 AM  Depression screen PHQ 2/9  Decreased Interest 0 0 0 0 1  Down, Depressed, Hopeless 0 0 0 0 1  PHQ - 2 Score 0 0 0 0 2  Altered sleeping 1 0 0 0 3  Tired, decreased energy 1 0 0 0 3  Change in appetite 0 0 0 0 0  Feeling bad or failure about yourself  1 0 0 0 0  Trouble concentrating 0 0 0 0 0  Moving slowly or fidgety/restless 0 0 0 0 0  Suicidal thoughts 0 0 0 0 0  PHQ-9 Score 3 0 0 0 8  Difficult doing work/chores Not difficult at all Not difficult at all Not difficult at all Not difficult at all     phq 9 is positive  Fall Risk:    06/12/2024   10:25 AM 05/20/2024    9:32 AM 01/19/2024    2:25 PM 06/12/2023   11:28 AM 05/15/2023    2:30 PM  Fall Risk   Falls in the past year? 0 0 1 0 0  Number falls in past yr: 0 0 0 0 0  Injury with Fall? 0 0 0 0 0  Risk for fall due to :  No Fall Risks Impaired balance/gait;No Fall Risks No Fall Risks No Fall Risks  Follow up Falls evaluation completed Falls evaluation completed Falls evaluation completed Falls prevention discussed Falls  prevention discussed      Assessment & Plan Major depressive disorder, recurrent, chronic Chronic depression managed  with citalopram  and Wellbutrin . Reports nighttime racing thoughts. - Continue citalopram  40 mg daily. - Continue Wellbutrin  XL 150 mg daily. - Encourage mindfulness and meditation for nighttime anxiety.  Insomnia, persistent Persistent insomnia managed with trazodone . Daytime grogginess likely due to irregular sleep schedule from night shifts. - Continue trazodone  100 mg at bedtime.  Chronic low back pain with lumbar radiculopathy Chronic low back pain managed with ibuprofen or Aleve. Discussed Lyrica or pregabalin for constant symptoms, advised against daily use due to side effects. - Use ibuprofen or Aleve as needed. - Consider Lyrica or pregabalin if symptoms become constant.  Migraine without aura Migraine episodes managed with Nurtec, preferred over sumatriptan . - Continue Nurtec as needed. - Ensure adequate supply of Nurtec.  Allergic rhinitis, perennial with seasonal variation Perennial allergic rhinitis managed with montelukast  and eye drops. Discussed Xyzal as an alternative to Claritin. - Continue montelukast  as needed. - Consider Xyzal as an alternative to Claritin.  Vitamin B12 deficiency Vitamin B12 level low at 319. - Take sublingual B12 supplement daily.  Seborrheic keratosis, facial Facial seborrheic keratosis not concerning currently. - Monitor for changes. - Consider dermatologist referral if lesion changes.  Hepatitis B immunization Not immunized against hepatitis B. - Administer first dose of hepatitis B vaccine today. - Schedule follow-up for second dose.

## 2024-07-05 DIAGNOSIS — H40013 Open angle with borderline findings, low risk, bilateral: Secondary | ICD-10-CM | POA: Diagnosis not present

## 2024-07-05 DIAGNOSIS — H1045 Other chronic allergic conjunctivitis: Secondary | ICD-10-CM | POA: Diagnosis not present

## 2024-07-16 ENCOUNTER — Ambulatory Visit

## 2024-09-13 ENCOUNTER — Other Ambulatory Visit (HOSPITAL_COMMUNITY): Payer: Self-pay

## 2024-09-13 ENCOUNTER — Telehealth: Payer: Self-pay | Admitting: Pharmacy Technician

## 2024-09-13 NOTE — Telephone Encounter (Signed)
 Pharmacy Patient Advocate Encounter   Received notification from [employer] that prior authorization for Nurtec 75MG  dispersible tablets  is due for renewal.   Insurance verification completed.   The patient is insured through [employer].  Action: PA required; PA started via CoverMyMeds. KEY [id number] . Waiting for clinical questions to populate.

## 2024-09-13 NOTE — Telephone Encounter (Signed)
 Pharmacy Patient Advocate Encounter  Received notification from [employer] that Prior Authorization for Nurtec 75MG  dispersible tablets has been APPROVED from 08/14/24 to 09/13/25. Ran test claim, Copay is $0.00. This test claim was processed through [facility]- copay amounts may vary at other pharmacies due to pharmacy/plan contracts, or as the patient moves through the different stages of their insurance plan.   PA #/Case ID/Reference #: [id number]

## 2024-09-16 ENCOUNTER — Other Ambulatory Visit (HOSPITAL_COMMUNITY): Payer: Self-pay

## 2024-12-12 ENCOUNTER — Ambulatory Visit: Admitting: Family Medicine

## 2025-05-21 ENCOUNTER — Encounter: Admitting: Family Medicine
